# Patient Record
Sex: Male | Born: 1975
Health system: Southern US, Community
[De-identification: ages and names within clinical notes are randomized; demographics above are authoritative.]

## PROBLEM LIST (undated history)

## (undated) DIAGNOSIS — M545 Low back pain, unspecified: Secondary | ICD-10-CM

## (undated) DIAGNOSIS — F1010 Alcohol abuse, uncomplicated: Secondary | ICD-10-CM

## (undated) DIAGNOSIS — F419 Anxiety disorder, unspecified: Secondary | ICD-10-CM

## (undated) DIAGNOSIS — Z9889 Other specified postprocedural states: Secondary | ICD-10-CM

## (undated) DIAGNOSIS — F32A Depression, unspecified: Secondary | ICD-10-CM

## (undated) DIAGNOSIS — G8929 Other chronic pain: Secondary | ICD-10-CM

## (undated) DIAGNOSIS — F1910 Other psychoactive substance abuse, uncomplicated: Secondary | ICD-10-CM

## (undated) DIAGNOSIS — R112 Nausea with vomiting, unspecified: Secondary | ICD-10-CM

## (undated) DIAGNOSIS — T7840XA Allergy, unspecified, initial encounter: Secondary | ICD-10-CM

## (undated) HISTORY — DX: Anxiety disorder, unspecified: F41.9

## (undated) HISTORY — DX: Other psychoactive substance abuse, uncomplicated: F19.10

## (undated) HISTORY — DX: Alcohol abuse, uncomplicated: F10.10

## (undated) HISTORY — PX: SPINE SURGERY: SHX786

## (undated) HISTORY — DX: Allergy, unspecified, initial encounter: T78.40XA

## (undated) HISTORY — PX: BACK SURGERY: SHX140

## (undated) HISTORY — DX: Depression, unspecified: F32.A

---
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
## 2006-04-18 ENCOUNTER — Ambulatory Visit: Payer: Self-pay | Admitting: Internal Medicine

## 2009-02-12 ENCOUNTER — Emergency Department (HOSPITAL_COMMUNITY): Admission: EM | Admit: 2009-02-12 | Discharge: 2009-02-12 | Payer: Self-pay | Admitting: Family Medicine

## 2009-10-27 ENCOUNTER — Ambulatory Visit: Payer: Self-pay | Admitting: Family Medicine

## 2009-10-27 DIAGNOSIS — E785 Hyperlipidemia, unspecified: Secondary | ICD-10-CM | POA: Insufficient documentation

## 2009-10-27 DIAGNOSIS — F1010 Alcohol abuse, uncomplicated: Secondary | ICD-10-CM | POA: Insufficient documentation

## 2009-10-27 DIAGNOSIS — I10 Essential (primary) hypertension: Secondary | ICD-10-CM | POA: Insufficient documentation

## 2009-10-29 LAB — CONVERTED CEMR LAB
BUN: 12 mg/dL (ref 6–23)
Basophils Absolute: 0 10*3/uL (ref 0.0–0.1)
Bilirubin, Direct: 0.1 mg/dL (ref 0.0–0.3)
Chloride: 105 meq/L (ref 96–112)
Cholesterol: 179 mg/dL (ref 0–200)
Creatinine, Ser: 1.4 mg/dL (ref 0.4–1.5)
Eosinophils Relative: 2 % (ref 0.0–5.0)
GFR calc non Af Amer: 61.06 mL/min (ref >60)
Glucose, Bld: 94 mg/dL (ref 70–99)
HCT: 49.2 % (ref 39.0–52.0)
LDL Cholesterol: 127 mg/dL — ABNORMAL HIGH (ref 0–99)
Lymphs Abs: 2.2 10*3/uL (ref 0.7–4.0)
MCV: 96.4 fL (ref 78.0–100.0)
Monocytes Absolute: 0.7 10*3/uL (ref 0.1–1.0)
Neutrophils Relative %: 60.7 % (ref 43.0–77.0)
Platelets: 188 10*3/uL (ref 150.0–400.0)
Potassium: 5.7 meq/L — ABNORMAL HIGH (ref 3.5–5.1)
RDW: 12.6 % (ref 11.5–14.6)
TSH: 1.24 microintl units/mL (ref 0.35–5.50)
Total Bilirubin: 0.9 mg/dL (ref 0.3–1.2)
Triglycerides: 108 mg/dL (ref 0.0–149.0)
VLDL: 21.6 mg/dL (ref 0.0–40.0)
WBC: 8 10*3/uL (ref 4.5–10.5)

## 2009-10-30 ENCOUNTER — Ambulatory Visit: Payer: Self-pay | Admitting: Family Medicine

## 2009-11-02 LAB — CONVERTED CEMR LAB
CO2: 29 meq/L (ref 19–32)
Chloride: 106 meq/L (ref 96–112)
Glucose, Bld: 90 mg/dL (ref 70–99)
Potassium: 4.8 meq/L (ref 3.5–5.1)
Sodium: 142 meq/L (ref 135–145)

## 2015-07-13 ENCOUNTER — Emergency Department (HOSPITAL_COMMUNITY)
Admission: EM | Admit: 2015-07-13 | Discharge: 2015-07-13 | Disposition: A | Discharge status: Home / Self Care | Payer: BLUE CROSS/BLUE SHIELD | Attending: Emergency Medicine | Admitting: Emergency Medicine

## 2015-07-13 ENCOUNTER — Encounter (HOSPITAL_COMMUNITY): Payer: Self-pay

## 2015-07-13 DIAGNOSIS — R200 Anesthesia of skin: Secondary | ICD-10-CM | POA: Diagnosis not present

## 2015-07-13 DIAGNOSIS — M5416 Radiculopathy, lumbar region: Secondary | ICD-10-CM

## 2015-07-13 DIAGNOSIS — M549 Dorsalgia, unspecified: Secondary | ICD-10-CM | POA: Diagnosis present

## 2015-07-13 MED ORDER — IBUPROFEN 600 MG PO TABS: 600.0000 mg | ORAL_TABLET | Freq: Four times a day (QID) | ORAL | Status: DC | PRN

## 2015-07-13 MED ORDER — HYDROMORPHONE HCL 1 MG/ML IJ SOLN
1.0000 mg | Freq: Once | INTRAMUSCULAR | Status: AC
  Administered 2015-07-13: 1 mg via INTRAMUSCULAR
  Filled 2015-07-13: qty 1

## 2015-07-13 MED ORDER — METHOCARBAMOL 500 MG PO TABS: 500.0000 mg | ORAL_TABLET | Freq: Four times a day (QID) | ORAL | Status: DC

## 2015-07-13 MED ORDER — OXYCODONE-ACETAMINOPHEN 5-325 MG PO TABS
1.0000 | ORAL_TABLET | Freq: Four times a day (QID) | ORAL | Status: DC | PRN
Start: 2015-07-13 — End: 2016-03-10

## 2015-07-13 MED ORDER — METHOCARBAMOL 500 MG PO TABS
500.0000 mg | ORAL_TABLET | Freq: Once | ORAL | Status: AC
  Administered 2015-07-13: 500 mg via ORAL
  Filled 2015-07-13: qty 1

## 2015-07-13 MED ORDER — ONDANSETRON 4 MG PO TBDP
4.0000 mg | ORAL_TABLET | Freq: Once | ORAL | Status: AC
  Administered 2015-07-13: 4 mg via ORAL
  Filled 2015-07-13: qty 1

## 2015-07-13 NOTE — ED Provider Notes (Signed)
CSN: [id number]     Arrival date & time 07/13/15  0548 History   First MD Initiated Contact with Patient 07/13/15 0601     Chief Complaint  Patient presents with  . Sciatica  . Back Pain     (Consider location/radiation/quality/duration/timing/severity/associated sxs/prior Treatment) HPI Comments: Patient presents with complaints of back pain with radiation into right leg which has been persistent over the past 2-3 weeks. Patient has had previous episodes as well, but never this severe before he has seen his primary care physician who has prescribed pain medication (Vicodin) and a muscle relaxer. He has also been taking ibuprofen. He has been having muscle spasms which move down the back of his right leg. Patient has a shooting sharp pain that goes down the back of his thigh and into the medial calf and foot. Pain is worse with movement. He did get an injection in his back for muscle spasms 2 weeks ago but this was IM and not an epidural type injection. Patient denies warning symptoms of back pain including: fecal incontinence, urinary retention or overflow incontinence, night sweats, unexplained fevers or weight loss, h/o cancer, IVDU, recent trauma.     Patient is a 39 y.o. male presenting with back pain. The history is provided by the patient.  Back Pain Associated symptoms: numbness (Right calf)   Associated symptoms: no fever and no weakness     History reviewed. No pertinent past medical history. History reviewed. No pertinent past surgical history. No family history on file. History  Substance Use Topics  . Smoking status: Never Smoker   . Smokeless tobacco: Current User  . Alcohol Use: Not on file    Review of Systems  Constitutional: Negative for fever and unexpected weight change.  Gastrointestinal: Negative for constipation.       Neg for fecal incontinence  Genitourinary: Negative for hematuria, flank pain and difficulty urinating.       Negative for urinary  incontinence or retention  Musculoskeletal: Positive for back pain.  Neurological: Positive for numbness (Right calf). Negative for weakness.       Negative for saddle paresthesias       Allergies  Review of patient's allergies indicates no known allergies.  Home Medications   Prior to Admission medications   Medication Sig Start Date End Date Taking? Authorizing Provider  ibuprofen (ADVIL,MOTRIN) 200 MG tablet Take 200 mg by mouth every 6 (six) hours as needed for moderate pain.   Yes Historical Provider, MD   BP 126/78 mmHg  Pulse 77  Temp(Src) 98.4 F (36.9 C) (Oral)  Resp 22  Ht 6\' 4"  (1.93 m)  Wt 212 lb (96.163 kg)  BMI 25.82 kg/m2  SpO2 99% Physical Exam  Constitutional: He appears well-developed and well-nourished.  HENT:  Head: Normocephalic and atraumatic.  Eyes: Conjunctivae are normal.  Neck: Normal range of motion.  Abdominal: Soft. There is no tenderness. There is no CVA tenderness.  Musculoskeletal: Normal range of motion. He exhibits no tenderness.  No step-off noted with palpation of spine. + straight leg test on right.   Neurological: He is alert. He has normal reflexes. No sensory deficit. He exhibits normal muscle tone.  5/5 strength in entire lower extremities bilaterally. Decreased sensation medial R foot and calf, but not true numbness.   Skin: Skin is warm and dry.  Psychiatric: He has a normal mood and affect.  Nursing note and vitals reviewed.   ED Course  Procedures (including critical care time) Labs Review Labs Reviewed -  No data to display  Imaging Review No results found.   EKG Interpretation None      6:10 AM Patient seen and examined. Medications ordered.   Vital signs reviewed and are as follows: Filed Vitals:   07/13/15 0555  BP: 126/78  Pulse: 77  Temp: 98.4 F (36.9 C)  Resp: 22    7:25 AM Patient notes improvement in symptoms.   No red flag s/s of low back pain. Patient was counseled on back pain precautions and  told to do activity as tolerated but do not lift, push, or pull heavy objects more than 10 pounds for the next week.  Patient counseled to use ice or heat on back for no longer than 15 minutes every hour.   Patient prescribed muscle relaxer and counseled on proper use of muscle relaxant medication.    Patient prescribed narcotic pain medicine and counseled on proper use of narcotic pain medications. Counseled not to combine this medication with others containing tylenol. Discussed not to combine Percocet with the Vicodin he currently has at home.   Urged patient not to drink alcohol, drive, or perform any other activities that requires focus while taking either of these medications.  Patient urged to follow-up with PCP if pain does not improve with treatment and rest or if pain becomes recurrent. Urged to return with worsening severe pain, loss of bowel or bladder control, trouble walking.   The patient verbalizes understanding and agrees with the plan.    MDM   Final diagnoses:  Lumbar radiculopathy   Patient with back pain with lumbar radicular features in S1 distribution. No neurological deficits. Patient is ambulatory. No warning symptoms of back pain including: fecal incontinence, urinary retention or overflow incontinence, night sweats, waking from sleep with back pain, unexplained fevers or weight loss, h/o cancer, IVDU, recent trauma. No concern for cauda equina, epidural abscess, or other serious cause of back pain. Conservative measures such as rest, ice/heat and pain medicine indicated with PCP/ortho/NS follow-up.      Renne Crigler, PA-C 07/13/15 0745  Marisa Severin, MD 07/13/15 [phone]

## 2015-07-13 NOTE — ED Notes (Signed)
Pt states that woke up around 330 am with shooting pain down leg, states has had back pain for the past month, waiting to get in with [facility], never diagnosed with sciatica, states he thinks he has a bulging disk.

## 2015-07-13 NOTE — Discharge Instructions (Signed)
Please read and follow all provided instructions.  Your diagnoses today include:  1. Lumbar radiculopathy    Tests performed today include:  Vital signs - see below for your results today  Medications prescribed:   Percocet (oxycodone/acetaminophen) - narcotic pain medication  DO NOT drive or perform any activities that require you to be awake and alert because this medicine can make you drowsy. BE VERY CAREFUL not to take multiple medicines containing Tylenol (also called acetaminophen). Doing so can lead to an overdose which can damage your liver and cause liver failure and possibly death.   Ibuprofen (Motrin, Advil) - anti-inflammatory pain medication  Do not exceed  ibuprofen every 6 hours, take with food  You have been prescribed an anti-inflammatory medication or NSAID. Take with food. Take smallest effective dose for the shortest duration needed for your pain. Stop taking if you experience stomach pain or vomiting.    Robaxin (methocarbamol) - muscle relaxer medication  DO NOT drive or perform any activities that require you to be awake and alert because this medicine can make you drowsy.   Take any prescribed medications only as directed.  Home care instructions:   Follow any educational materials contained in this packet  Please rest, use ice or heat on your back for the next several days  Do not lift, push, pull anything more than 10 pounds for the next week  Follow-up instructions: Please follow-up with your primary care provider in the next 1 week for further evaluation of your symptoms. Follow-up with ortho referral as planned.   Return instructions:  SEEK IMMEDIATE MEDICAL ATTENTION IF YOU HAVE:  New weakness, or problem with the use of your arms or legs  Severe back pain not relieved with medications  Loss control of your bowels or bladder  Increasing pain in any areas of the body (such as chest or abdominal pain)  Shortness of breath, dizziness,  or fainting.   Worsening nausea (feeling sick to your stomach), vomiting, fever, or sweats  Any other emergent concerns regarding your health   Additional Information:  Your vital signs today were: BP 123/77 mmHg   Pulse 68   Temp(Src) 98.4 F (36.9 C) (Oral)   Resp 22   Ht  (1.93 m)   Wt 212 lb (96.163 kg)   BMI 25.82 kg/m2   SpO2 97% If your blood pressure (BP) was elevated above 135/85 this visit, please have this repeated by your doctor within one month. --------------

## 2015-07-17 ENCOUNTER — Other Ambulatory Visit: Payer: Self-pay | Admitting: Sports Medicine

## 2015-07-17 DIAGNOSIS — M545 Low back pain: Secondary | ICD-10-CM

## 2015-07-24 ENCOUNTER — Ambulatory Visit
Admission: RE | Admit: 2015-07-24 | Discharge: 2015-07-24 | Disposition: A | Discharge status: Home / Self Care | Payer: BLUE CROSS/BLUE SHIELD | Source: Ambulatory Visit | Attending: Sports Medicine | Admitting: Sports Medicine

## 2015-07-24 DIAGNOSIS — M545 Low back pain: Secondary | ICD-10-CM

## 2016-02-11 ENCOUNTER — Other Ambulatory Visit: Payer: Self-pay | Admitting: Orthopaedic Surgery

## 2016-02-11 DIAGNOSIS — M545 Low back pain: Secondary | ICD-10-CM

## 2016-02-20 ENCOUNTER — Ambulatory Visit
Admission: RE | Admit: 2016-02-20 | Discharge: 2016-02-20 | Disposition: A | Discharge status: Home / Self Care | Payer: 59 | Source: Ambulatory Visit | Attending: Orthopaedic Surgery | Admitting: Orthopaedic Surgery

## 2016-02-20 DIAGNOSIS — M545 Low back pain: Secondary | ICD-10-CM

## 2016-03-02 ENCOUNTER — Other Ambulatory Visit: Payer: Self-pay | Admitting: Orthopaedic Surgery

## 2016-03-10 NOTE — Pre-Procedure Instructions (Signed)
    David HolmCraig Liscano  03/10/2016      [facility] [id number] [place]- [place], [place] - [address] [facility] [address] [facility] [address] [facility] [address] [place] [id number] Phone: [phone] Fax: [phone]    Your procedure is scheduled on Fri, Mar 24 @ 9:50 AM  [facility] Admitting at 7:45 AM  Call this number if you have problems the morning of surgery:  [phone]   Remember:  Do not eat food or drink liquids after midnight.  Take these medicines the morning of surgery with A SIP OF WATER Gabapentin(Neurontin) and Pain Pill(if needed)              Stop taking your Ibuprofen. No Goody's,BC's,Aleve,Advil,Motrin,Aspirin,Fish Oil,or any Herbal Medications.    Do not wear jewelry.  Do not wear lotions, powders, or colognes.  You may wear deodorant.             Men may shave face and neck.  Do not bring valuables to the hospital.  [facility] is not responsible for any belongings or valuables.  Contacts, dentures or bridgework may not be worn into surgery.  Leave your suitcase in the car.  After surgery it may be brought to your room.  For patients admitted to the hospital, discharge time will be determined by your treatment team.  Patients discharged the day of surgery will not be allowed to drive home.    Special instructions:    Please read over the following fact sheets that you were given. Pain Booklet, Coughing and Deep Breathing, MRSA Information and Surgical Site Infection Prevention

## 2016-03-11 ENCOUNTER — Encounter (HOSPITAL_COMMUNITY): Payer: Self-pay

## 2016-03-11 ENCOUNTER — Ambulatory Visit (HOSPITAL_COMMUNITY)
Admission: RE | Admit: 2016-03-11 | Discharge: 2016-03-11 | Disposition: A | Discharge status: Home / Self Care | Payer: 59 | Source: Ambulatory Visit | Attending: Orthopaedic Surgery | Admitting: Orthopaedic Surgery

## 2016-03-11 ENCOUNTER — Encounter (HOSPITAL_COMMUNITY)
Admission: RE | Admit: 2016-03-11 | Discharge: 2016-03-11 | Disposition: A | Discharge status: Home / Self Care | Payer: 59 | Source: Ambulatory Visit | Attending: Orthopaedic Surgery | Admitting: Orthopaedic Surgery

## 2016-03-11 DIAGNOSIS — M5126 Other intervertebral disc displacement, lumbar region: Secondary | ICD-10-CM

## 2016-03-11 DIAGNOSIS — Z01818 Encounter for other preprocedural examination: Secondary | ICD-10-CM | POA: Insufficient documentation

## 2016-03-11 DIAGNOSIS — Z01812 Encounter for preprocedural laboratory examination: Secondary | ICD-10-CM | POA: Insufficient documentation

## 2016-03-11 HISTORY — DX: Other specified postprocedural states: Z98.890

## 2016-03-11 HISTORY — DX: Other specified postprocedural states: R11.2

## 2016-03-11 LAB — COMPREHENSIVE METABOLIC PANEL
ALBUMIN: 4 g/dL (ref 3.5–5.0)
ALK PHOS: 55 U/L (ref 38–126)
ALT: 38 U/L (ref 17–63)
ANION GAP: 7 (ref 5–15)
AST: 26 U/L (ref 15–41)
BUN: 20 mg/dL (ref 6–20)
CALCIUM: 9.5 mg/dL (ref 8.9–10.3)
CO2: 28 mmol/L (ref 22–32)
Chloride: 107 mmol/L (ref 101–111)
Creatinine, Ser: 0.98 mg/dL (ref 0.61–1.24)
GFR calc Af Amer: >60 mL/min (ref >60)
GFR calc non Af Amer: >60 mL/min (ref >60)
GLUCOSE: 93 mg/dL (ref 65–99)
POTASSIUM: 4.7 mmol/L (ref 3.5–5.1)
SODIUM: 142 mmol/L (ref 135–145)
TOTAL PROTEIN: 6.7 g/dL (ref 6.5–8.1)
Total Bilirubin: 0.3 mg/dL (ref 0.3–1.2)

## 2016-03-11 LAB — CBC
HCT: 44.4 % (ref 39.0–52.0)
Hemoglobin: 14.8 g/dL (ref 13.0–17.0)
MCH: 30.5 pg (ref 26.0–34.0)
MCHC: 33.3 g/dL (ref 30.0–36.0)
MCV: 91.4 fL (ref 78.0–100.0)
Platelets: 173 10*3/uL (ref 150–400)
RBC: 4.86 MIL/uL (ref 4.22–5.81)
RDW: 12.6 % (ref 11.5–15.5)
WBC: 6.1 10*3/uL (ref 4.0–10.5)

## 2016-03-11 LAB — PROTIME-INR
INR: 1.02 (ref 0.00–1.49)
Prothrombin Time: 13.6 seconds (ref 11.6–15.2)

## 2016-03-11 LAB — SURGICAL PCR SCREEN
MRSA, PCR: NEGATIVE
STAPHYLOCOCCUS AUREUS: NEGATIVE

## 2016-03-17 NOTE — H&P (Signed)
David HolmCraig Salazar is an 40 y.o. male.    The patient returns.  He is having persistent problems with back and right leg pain with persistent radicular symptoms, weakness in his leg, pain that bothers him at night, pain that bothers him when he tries to work.  He denies left leg symptoms.  He has had a epidural and due to continued ongoing symptoms, which has been going on now for almost a year, a MRI was obtained on 02/22/2016 lumbar and it is compared to 07/24/2015 lumbar MRI.  This shows some resolution of the hematoma that was present at the 5-1 level on the right and he has an extruded fragment inferior with severe right subarticular compression of the S1 nerve root.  The patient had associated hemorrhage at the time which was epidural and this has resolved.  There is continued mass effect on the right S1 nerve root.  He has slight extraforaminal protrusions on the left at 3-4 and 4-5 which are asymptomatic.   We discussed degenerative disk disease, narrowing, edematous changes at the disk space.  We discussed options including repeat epidural.  The patient states at this point he wants to proceed with operative intervention with microdiscectomy.  He has been using ibuprofen also hydrocodone 5/325.  He has been on Tylenol #3, Ultram, Medrol Dosepak, gabapentin, amitriptyline, Valium among others for treatment.  He is continuing to use Norco at this point and states he wants to be off the medicine, is concerned about how much he has consumed.   FAMILY HISTORY:   Positive for diabetes.   SOCIAL HISTORY:   Married to his wife Erskine SquibbJane.  Does not smoke or drink.   REVIEW OF SYSTEMS:   A 14-point review of systems negative for cardiac problems from rheumatologic.  No pulmonary problems.       Past Medical History  Diagnosis Date  . PONV (postoperative nausea and vomiting)     Past Surgical History  Procedure Laterality Date  . Wisdom tooth extraction      No family history on file. Social History:   reports that he has been smoking E-cigarettes and Cigarettes.  He has smoked for the past 15 years. He uses smokeless tobacco. He reports that he does not drink alcohol or use illicit drugs.  Allergies: No Known Allergies  No prescriptions prior to admission    No results found for this or any previous visit (from the past 48 hour(s)). No results found.  Review of Systems  Constitutional: Negative.   HENT: Negative.   Respiratory: Negative.   Cardiovascular: Negative.   Gastrointestinal: Negative.   Genitourinary: Negative.   Musculoskeletal: Positive for back pain.  Skin: Negative.   Psychiatric/Behavioral: Negative.     There were no vitals taken for this visit. Physical Exam  Constitutional: He is oriented to person, place, and time. No distress.  HENT:  Head: Atraumatic.  Eyes: EOM are normal.  Neck: Normal range of motion.  Cardiovascular: Normal rate.   Respiratory: No respiratory distress.  GI: He exhibits no distension.  Musculoskeletal: He exhibits tenderness.  Neurological: He is alert and oriented to person, place, and time.  Skin: Skin is warm and dry.  Psychiatric: He has a normal mood and affect.    PHYSICAL EXAMINATION:  The patient is alert and oriented, WD, WN, NAD.  Extraocular movements intact.  Good range of motion of cervical spine.  Reflexes are 2+ in the upper extremities.  He has absent ankle jerk on the right, 2+ on  the left.  Positive straight leg raising for ipsilateral and he has positive contralateral straight leg raise.  No clonus.  He fatigues out with toe raises quickly on the right, not on the left.     ASSESSMENT:  Right L5-S1 HNP with some extruded fragment, continued S1 nerve root compression.   PLAN:  The patient would like to proceed with the microdiscectomy.  We discussed overnight stay, operative technique.  Discussed risk.  Looked at the spine model.  Informed consent was obtained.  He understands that he would be on a walker program,  avoid sitting for 6 weeks after surgery and then can resume normal work activities.  We will give him a work note once we determine the date per scheduling.  All questions answered.     Naida Sleight, PA-C 03/17/2016, 2:39 PM

## 2016-03-18 ENCOUNTER — Observation Stay (HOSPITAL_COMMUNITY)
Admission: RE | Admit: 2016-03-18 | Discharge: 2016-03-19 | Disposition: A | Discharge status: Home / Self Care | Payer: 59 | Source: Ambulatory Visit | Attending: Orthopaedic Surgery | Admitting: Orthopaedic Surgery

## 2016-03-18 ENCOUNTER — Encounter (HOSPITAL_COMMUNITY)
Admission: RE | Disposition: A | Discharge status: Home / Self Care | Payer: Self-pay | Source: Ambulatory Visit | Attending: Orthopaedic Surgery

## 2016-03-18 ENCOUNTER — Ambulatory Visit (HOSPITAL_COMMUNITY): Payer: 59

## 2016-03-18 ENCOUNTER — Ambulatory Visit (HOSPITAL_COMMUNITY): Payer: 59 | Admitting: Anesthesiology

## 2016-03-18 ENCOUNTER — Encounter (HOSPITAL_COMMUNITY): Payer: Self-pay | Admitting: Anesthesiology

## 2016-03-18 DIAGNOSIS — F17210 Nicotine dependence, cigarettes, uncomplicated: Secondary | ICD-10-CM | POA: Insufficient documentation

## 2016-03-18 DIAGNOSIS — M5117 Intervertebral disc disorders with radiculopathy, lumbosacral region: Secondary | ICD-10-CM | POA: Diagnosis not present

## 2016-03-18 DIAGNOSIS — Z23 Encounter for immunization: Secondary | ICD-10-CM | POA: Diagnosis not present

## 2016-03-18 DIAGNOSIS — Z9889 Other specified postprocedural states: Secondary | ICD-10-CM

## 2016-03-18 DIAGNOSIS — Z419 Encounter for procedure for purposes other than remedying health state, unspecified: Secondary | ICD-10-CM

## 2016-03-18 HISTORY — DX: Low back pain: M54.5

## 2016-03-18 HISTORY — PX: LUMBAR LAMINECTOMY/DECOMPRESSION MICRODISCECTOMY: SHX5026

## 2016-03-18 HISTORY — DX: Other chronic pain: G89.29

## 2016-03-18 HISTORY — PX: MICRODISCECTOMY LUMBAR: SUR864

## 2016-03-18 HISTORY — DX: Low back pain, unspecified: M54.50

## 2016-03-18 SURGERY — LUMBAR LAMINECTOMY/DECOMPRESSION MICRODISCECTOMY
Anesthesia: General | Site: Back

## 2016-03-18 MED ORDER — FENTANYL CITRATE (PF) 250 MCG/5ML IJ SOLN
INTRAMUSCULAR | Status: AC
  Filled 2016-03-18: qty 5

## 2016-03-18 MED ORDER — DEXAMETHASONE SODIUM PHOSPHATE 10 MG/ML IJ SOLN
INTRAMUSCULAR | Status: AC
  Filled 2016-03-18: qty 1

## 2016-03-18 MED ORDER — OXYCODONE-ACETAMINOPHEN 5-325 MG PO TABS: 1.0000 | ORAL_TABLET | Freq: Four times a day (QID) | ORAL | Status: DC | PRN

## 2016-03-18 MED ORDER — METHOCARBAMOL 500 MG PO TABS: 500.0000 mg | ORAL_TABLET | Freq: Four times a day (QID) | ORAL | Status: DC | PRN

## 2016-03-18 MED ORDER — EPHEDRINE SULFATE 50 MG/ML IJ SOLN
INTRAMUSCULAR | Status: DC | PRN
  Administered 2016-03-18: 10 mg via INTRAVENOUS

## 2016-03-18 MED ORDER — SODIUM CHLORIDE 0.9 % IV SOLN: 250.0000 mL | INTRAVENOUS | Status: DC

## 2016-03-18 MED ORDER — DEXTROSE 5 % IV SOLN
2.0000 g | INTRAVENOUS | Status: AC
  Administered 2016-03-18: 2 g via INTRAVENOUS
  Filled 2016-03-18: qty 20

## 2016-03-18 MED ORDER — ONDANSETRON HCL 4 MG/2ML IJ SOLN
INTRAMUSCULAR | Status: AC
  Filled 2016-03-18: qty 2

## 2016-03-18 MED ORDER — GLYCOPYRROLATE 0.2 MG/ML IJ SOLN
INTRAMUSCULAR | Status: DC | PRN
  Administered 2016-03-18: 0.4 mg via INTRAVENOUS

## 2016-03-18 MED ORDER — BISACODYL 10 MG RE SUPP: 10.0000 mg | Freq: Every day | RECTAL | Status: DC | PRN

## 2016-03-18 MED ORDER — DOCUSATE SODIUM 100 MG PO CAPS
100.0000 mg | ORAL_CAPSULE | Freq: Two times a day (BID) | ORAL | Status: DC
  Administered 2016-03-18 – 2016-03-19 (×2): 100 mg via ORAL
  Filled 2016-03-18 (×3): qty 1

## 2016-03-18 MED ORDER — ROCURONIUM BROMIDE 100 MG/10ML IV SOLN
INTRAVENOUS | Status: DC | PRN
  Administered 2016-03-18: 50 mg via INTRAVENOUS

## 2016-03-18 MED ORDER — AMITRIPTYLINE HCL 25 MG PO TABS
25.0000 mg | ORAL_TABLET | Freq: Every day | ORAL | Status: DC
  Administered 2016-03-18: 25 mg via ORAL
  Filled 2016-03-18: qty 1

## 2016-03-18 MED ORDER — SODIUM CHLORIDE 0.45 % IV SOLN
INTRAVENOUS | Status: DC
Start: 2016-03-18 — End: 2016-03-19

## 2016-03-18 MED ORDER — INFLUENZA VAC SPLIT QUAD 0.5 ML IM SUSY
0.5000 mL | PREFILLED_SYRINGE | INTRAMUSCULAR | Status: AC
  Administered 2016-03-19: 0.5 mL via INTRAMUSCULAR
  Filled 2016-03-18: qty 0.5

## 2016-03-18 MED ORDER — HYDROMORPHONE HCL 1 MG/ML IJ SOLN
0.2500 mg | INTRAMUSCULAR | Status: DC | PRN
  Administered 2016-03-18 (×2): 0.5 mg via INTRAVENOUS

## 2016-03-18 MED ORDER — METHOCARBAMOL 1000 MG/10ML IJ SOLN
500.0000 mg | Freq: Four times a day (QID) | INTRAVENOUS | Status: DC | PRN
  Filled 2016-03-18: qty 5

## 2016-03-18 MED ORDER — ROCURONIUM BROMIDE 50 MG/5ML IV SOLN
INTRAVENOUS | Status: AC
  Filled 2016-03-18: qty 1

## 2016-03-18 MED ORDER — PHENOL 1.4 % MT LIQD: 1.0000 | OROMUCOSAL | Status: DC | PRN

## 2016-03-18 MED ORDER — 0.9 % SODIUM CHLORIDE (POUR BTL) OPTIME
TOPICAL | Status: DC | PRN
  Administered 2016-03-18: 1000 mL

## 2016-03-18 MED ORDER — DEXAMETHASONE SODIUM PHOSPHATE 10 MG/ML IJ SOLN
INTRAMUSCULAR | Status: DC | PRN
  Administered 2016-03-18: 10 mg via INTRAVENOUS

## 2016-03-18 MED ORDER — MENTHOL 3 MG MT LOZG: 1.0000 | LOZENGE | OROMUCOSAL | Status: DC | PRN

## 2016-03-18 MED ORDER — CEFAZOLIN SODIUM 1-5 GM-% IV SOLN
1.0000 g | Freq: Three times a day (TID) | INTRAVENOUS | Status: AC
  Administered 2016-03-18 – 2016-03-19 (×2): 1 g via INTRAVENOUS
  Filled 2016-03-18 (×2): qty 50

## 2016-03-18 MED ORDER — BUPIVACAINE HCL (PF) 0.25 % IJ SOLN
INTRAMUSCULAR | Status: DC | PRN
  Administered 2016-03-18: 10 mL

## 2016-03-18 MED ORDER — ONDANSETRON HCL 4 MG/2ML IJ SOLN: 4.0000 mg | INTRAMUSCULAR | Status: DC | PRN

## 2016-03-18 MED ORDER — PROMETHAZINE HCL 25 MG/ML IJ SOLN: 6.2500 mg | INTRAMUSCULAR | Status: DC | PRN

## 2016-03-18 MED ORDER — HYDROCODONE-ACETAMINOPHEN 7.5-325 MG PO TABS: 1.0000 | ORAL_TABLET | Freq: Once | ORAL | Status: DC | PRN

## 2016-03-18 MED ORDER — SODIUM CHLORIDE 0.9% FLUSH
3.0000 mL | Freq: Two times a day (BID) | INTRAVENOUS | Status: DC
  Administered 2016-03-18 (×2): 3 mL via INTRAVENOUS

## 2016-03-18 MED ORDER — LIDOCAINE HCL (CARDIAC) 20 MG/ML IV SOLN
INTRAVENOUS | Status: AC
Start: 2016-03-18 — End: 2016-03-18
  Filled 2016-03-18: qty 5

## 2016-03-18 MED ORDER — LACTATED RINGERS IV SOLN
INTRAVENOUS | Status: DC
Start: 2016-03-18 — End: 2016-03-19
  Administered 2016-03-18 (×3): via INTRAVENOUS

## 2016-03-18 MED ORDER — ONDANSETRON HCL 4 MG/2ML IJ SOLN
INTRAMUSCULAR | Status: DC | PRN
  Administered 2016-03-18: 4 mg via INTRAVENOUS

## 2016-03-18 MED ORDER — MIDAZOLAM HCL 2 MG/2ML IJ SOLN
INTRAMUSCULAR | Status: AC
  Filled 2016-03-18: qty 2

## 2016-03-18 MED ORDER — POLYETHYLENE GLYCOL 3350 17 G PO PACK: 17.0000 g | PACK | Freq: Every day | ORAL | Status: DC | PRN

## 2016-03-18 MED ORDER — FENTANYL CITRATE (PF) 100 MCG/2ML IJ SOLN
INTRAMUSCULAR | Status: DC | PRN
  Administered 2016-03-18: 100 ug via INTRAVENOUS
  Administered 2016-03-18: 50 ug via INTRAVENOUS
  Administered 2016-03-18 (×3): 100 ug via INTRAVENOUS
  Administered 2016-03-18: 50 ug via INTRAVENOUS

## 2016-03-18 MED ORDER — HYDROMORPHONE HCL 1 MG/ML IJ SOLN
0.5000 mg | INTRAMUSCULAR | Status: DC | PRN
  Administered 2016-03-18 (×2): 1 mg via INTRAVENOUS
  Filled 2016-03-18 (×2): qty 1

## 2016-03-18 MED ORDER — GLYCOPYRROLATE 0.2 MG/ML IJ SOLN
INTRAMUSCULAR | Status: AC
  Filled 2016-03-18: qty 2

## 2016-03-18 MED ORDER — BUPIVACAINE HCL (PF) 0.25 % IJ SOLN
INTRAMUSCULAR | Status: AC
  Filled 2016-03-18: qty 30

## 2016-03-18 MED ORDER — CHLORHEXIDINE GLUCONATE 4 % EX LIQD
60.0000 mL | Freq: Once | CUTANEOUS | Status: DC
Start: 2016-03-18 — End: 2016-03-18

## 2016-03-18 MED ORDER — PROPOFOL 10 MG/ML IV BOLUS
INTRAVENOUS | Status: AC
  Filled 2016-03-18: qty 20

## 2016-03-18 MED ORDER — SODIUM CHLORIDE 0.9% FLUSH: 3.0000 mL | INTRAVENOUS | Status: DC | PRN

## 2016-03-18 MED ORDER — HYDROMORPHONE HCL 1 MG/ML IJ SOLN
INTRAMUSCULAR | Status: AC
Start: 2016-03-18 — End: 2016-03-18
  Administered 2016-03-18: 1 mg via INTRAVENOUS
  Filled 2016-03-18: qty 1

## 2016-03-18 MED ORDER — MIDAZOLAM HCL 5 MG/5ML IJ SOLN
INTRAMUSCULAR | Status: DC | PRN
  Administered 2016-03-18: 2 mg via INTRAVENOUS

## 2016-03-18 MED ORDER — GABAPENTIN 300 MG PO CAPS
300.0000 mg | ORAL_CAPSULE | Freq: Three times a day (TID) | ORAL | Status: DC
  Administered 2016-03-18 – 2016-03-19 (×3): 300 mg via ORAL
  Filled 2016-03-18 (×3): qty 1

## 2016-03-18 MED ORDER — NEOSTIGMINE METHYLSULFATE 10 MG/10ML IV SOLN
INTRAVENOUS | Status: AC
  Filled 2016-03-18: qty 1

## 2016-03-18 MED ORDER — NEOSTIGMINE METHYLSULFATE 10 MG/10ML IV SOLN
INTRAVENOUS | Status: DC | PRN
  Administered 2016-03-18: 3 mg via INTRAVENOUS

## 2016-03-18 MED ORDER — PROPOFOL 10 MG/ML IV BOLUS
INTRAVENOUS | Status: DC | PRN
  Administered 2016-03-18: 200 mg via INTRAVENOUS

## 2016-03-18 MED ORDER — LIDOCAINE HCL (CARDIAC) 20 MG/ML IV SOLN
INTRAVENOUS | Status: DC | PRN
  Administered 2016-03-18: 60 mg via INTRAVENOUS

## 2016-03-18 MED ORDER — OXYCODONE-ACETAMINOPHEN 5-325 MG PO TABS
1.0000 | ORAL_TABLET | Freq: Four times a day (QID) | ORAL | Status: DC | PRN
  Administered 2016-03-18 – 2016-03-19 (×3): 2 via ORAL
  Filled 2016-03-18 (×3): qty 2

## 2016-03-18 MED ORDER — METHOCARBAMOL 500 MG PO TABS
500.0000 mg | ORAL_TABLET | Freq: Four times a day (QID) | ORAL | Status: DC | PRN
  Administered 2016-03-18 – 2016-03-19 (×2): 500 mg via ORAL
  Filled 2016-03-18 (×2): qty 1

## 2016-03-18 SURGICAL SUPPLY — 47 items
ADH SKN CLS APL DERMABOND .7 (GAUZE/BANDAGES/DRESSINGS) ×1
APL SKNCLS STERI-STRIP NONHPOA (GAUZE/BANDAGES/DRESSINGS) ×1
BENZOIN TINCTURE PRP APPL 2/3 (GAUZE/BANDAGES/DRESSINGS) ×1 IMPLANT
BUR ROUND FLUTED 4 SOFT TCH (BURR) ×1 IMPLANT
CLSR STERI-STRIP ANTIMIC 1/2X4 (GAUZE/BANDAGES/DRESSINGS) ×2 IMPLANT
COVER SURGICAL LIGHT HANDLE (MISCELLANEOUS) ×2 IMPLANT
DERMABOND ADVANCED (GAUZE/BANDAGES/DRESSINGS) ×1
DERMABOND ADVANCED .7 DNX12 (GAUZE/BANDAGES/DRESSINGS) ×1 IMPLANT
DRAPE MICROSCOPE LEICA (MISCELLANEOUS) ×2 IMPLANT
DRAPE PROXIMA HALF (DRAPES) ×4 IMPLANT
DRSG MEPILEX BORDER 4X4 (GAUZE/BANDAGES/DRESSINGS) ×1 IMPLANT
DRSG MEPILEX BORDER 4X8 (GAUZE/BANDAGES/DRESSINGS) ×2 IMPLANT
DURAPREP 26ML APPLICATOR (WOUND CARE) ×2 IMPLANT
ELECT REM PT RETURN 9FT ADLT (ELECTROSURGICAL) ×2
ELECTRODE REM PT RTRN 9FT ADLT (ELECTROSURGICAL) ×1 IMPLANT
GLOVE BIO SURGEON STRL SZ7 (GLOVE) ×2 IMPLANT
GLOVE BIOGEL PI IND STRL 8 (GLOVE) ×2 IMPLANT
GLOVE BIOGEL PI INDICATOR 8 (GLOVE) ×2
GLOVE ORTHO TXT STRL SZ7.5 (GLOVE) ×4 IMPLANT
GOWN STRL REUS W/ TWL LRG LVL3 (GOWN DISPOSABLE) ×2 IMPLANT
GOWN STRL REUS W/ TWL XL LVL3 (GOWN DISPOSABLE) ×1 IMPLANT
GOWN STRL REUS W/TWL 2XL LVL3 (GOWN DISPOSABLE) ×2 IMPLANT
GOWN STRL REUS W/TWL LRG LVL3 (GOWN DISPOSABLE) ×4
GOWN STRL REUS W/TWL XL LVL3 (GOWN DISPOSABLE) ×2
KIT BASIN OR (CUSTOM PROCEDURE TRAY) ×2 IMPLANT
KIT ROOM TURNOVER OR (KITS) ×2 IMPLANT
MANIFOLD NEPTUNE II (INSTRUMENTS) ×3 IMPLANT
NDL HYPO 25GX1X1/2 BEV (NEEDLE) ×1 IMPLANT
NDL SPNL 18GX3.5 QUINCKE PK (NEEDLE) ×1 IMPLANT
NEEDLE HYPO 25GX1X1/2 BEV (NEEDLE) ×2 IMPLANT
NEEDLE SPNL 18GX3.5 QUINCKE PK (NEEDLE) ×4 IMPLANT
NS IRRIG 1000ML POUR BTL (IV SOLUTION) ×2 IMPLANT
PACK LAMINECTOMY ORTHO (CUSTOM PROCEDURE TRAY) ×2 IMPLANT
PAD ARMBOARD 7.5X6 YLW CONV (MISCELLANEOUS) ×4 IMPLANT
PATTIES SURGICAL .5 X.5 (GAUZE/BANDAGES/DRESSINGS) ×1 IMPLANT
PATTIES SURGICAL .75X.75 (GAUZE/BANDAGES/DRESSINGS) IMPLANT
POSITIONER HEAD PRONE TRACH (MISCELLANEOUS) ×1 IMPLANT
SUT BONE WAX W31G (SUTURE) IMPLANT
SUT VIC AB 0 CT1 27 (SUTURE) ×2
SUT VIC AB 0 CT1 27XBRD ANBCTR (SUTURE) ×1 IMPLANT
SUT VIC AB 1 CTX 18 (SUTURE) ×1 IMPLANT
SUT VIC AB 2-0 CT1 27 (SUTURE) ×2
SUT VIC AB 2-0 CT1 TAPERPNT 27 (SUTURE) ×1 IMPLANT
SUT VIC AB 3-0 X1 27 (SUTURE) IMPLANT
TOWEL OR 17X24 6PK STRL BLUE (TOWEL DISPOSABLE) ×2 IMPLANT
TOWEL OR 17X26 10 PK STRL BLUE (TOWEL DISPOSABLE) ×3 IMPLANT
WATER STERILE IRR 1000ML POUR (IV SOLUTION) ×2 IMPLANT

## 2016-03-18 NOTE — Brief Op Note (Signed)
03/18/2016  12:13 PM  PATIENT:  David Salazar  40 y.o. male  PRE-OPERATIVE DIAGNOSIS:  Right L5-S1 Herniated Nucleus Pulposus  POST-OPERATIVE DIAGNOSIS:  Right L5-S1 Herniated Nucleus Pulposus  PROCEDURE:  Procedure(s): Right L5-S1 Microdiscectomy (N/A)  SURGEON:  Surgeon(s) and Role:    Eldred Manges* Mark C Yates, MD - Primary  PHYSICIAN ASSISTANT: Peityn Payton m. Mabry Santarelli pa-c    ANESTHESIA:   general  EBL:  Total I/O In: 1600 [I.V.:1600] Out: 50 [Blood:50]  BLOOD ADMINISTERED:none  DRAINS: none   LOCAL MEDICATIONS USED:  MARCAINE     SPECIMEN:  No Specimen  DISPOSITION OF SPECIMEN:  N/A  COUNTS:  YES  TOURNIQUET:  * No tourniquets in log *  DICTATION: .Dragon Dictation  PLAN OF CARE: Admit for overnight observation  PATIENT DISPOSITION:  PACU - hemodynamically stable.

## 2016-03-18 NOTE — Anesthesia Procedure Notes (Signed)
Procedure Name: Intubation Date/Time: 03/18/2016 10:15 AM Performed by: Orlinda BlalockMCMILLEN, Paiten Boies L Pre-anesthesia Checklist: Patient identified, Emergency Drugs available, Suction available and Patient being monitored Patient Re-evaluated:Patient Re-evaluated prior to inductionOxygen Delivery Method: Circle System Utilized Preoxygenation: Pre-oxygenation with 100% oxygen Intubation Type: IV induction Ventilation: Mask ventilation without difficulty Laryngoscope Size: Mac and 4 Grade View: Grade I Tube type: Oral Tube size: 8.0 mm Number of attempts: 1 Airway Equipment and Method: Stylet and Oral airway Placement Confirmation: ETT inserted through vocal cords under direct vision,  positive ETCO2 and breath sounds checked- equal and bilateral Secured at: 23 cm Tube secured with: Tape Dental Injury: Teeth and Oropharynx as per pre-operative assessment

## 2016-03-18 NOTE — Interval H&P Note (Signed)
History and Physical Interval Note:  03/18/2016 9:05 AM  David Salazar  has presented today for surgery, with the diagnosis of Right L5-S1 Herniated Nucleus Pulposus  The various methods of treatment have been discussed with the patient and family. After consideration of risks, benefits and other options for treatment, the patient has consented to  Procedure(s): Right L5-S1 Microdiscectomy (N/A) as a surgical intervention .  The patient's history has been reviewed, patient examined, no change in status, stable for surgery.  I have reviewed the patient's chart and labs.  Questions were answered to the patient's satisfaction.     Meg Niemeier C

## 2016-03-18 NOTE — Progress Notes (Signed)
Report given by Blane Oharaaroline RN from PACU. Patient in room and comfortable. Will continue to monitor.

## 2016-03-18 NOTE — Anesthesia Postprocedure Evaluation (Signed)
Anesthesia Post Note  Patient: Pricilla HolmCraig Magana  Procedure(s) Performed: Procedure(s) (LRB): Right L5-S1 Microdiscectomy (N/A)  Patient location during evaluation: PACU Anesthesia Type: General Level of consciousness: awake and alert Pain management: pain level controlled Vital Signs Assessment: post-procedure vital signs reviewed and stable Respiratory status: spontaneous breathing, nonlabored ventilation, respiratory function stable and patient connected to nasal cannula oxygen Cardiovascular status: blood pressure returned to baseline and stable Postop Assessment: no signs of nausea or vomiting Anesthetic complications: no    Last Vitals:  Filed Vitals:   03/18/16 1245 03/18/16 1300  BP: 107/84 104/67  Pulse: 64 59  Temp:    Resp: 12 8    Last Pain:  Filed Vitals:   03/18/16 1303  PainSc: Asleep                 Kennieth RadFitzgerald, Darenda Fike E

## 2016-03-18 NOTE — Anesthesia Preprocedure Evaluation (Addendum)
Anesthesia Evaluation  Patient identified by MRN, date of birth, ID band Patient awake    Reviewed: Allergy & Precautions, NPO status , Patient's Chart, lab work & pertinent test results  Airway Mallampati: II  TM Distance: >3 FB Neck ROM: Full    Dental  (+) Dental Advisory Given   Pulmonary Current Smoker,    breath sounds clear to auscultation       Cardiovascular negative cardio ROS   Rhythm:Regular Rate:Normal     Neuro/Psych negative neurological ROS     GI/Hepatic negative GI ROS, Neg liver ROS,   Endo/Other  negative endocrine ROS  Renal/GU negative Renal ROS     Musculoskeletal   Abdominal   Peds  Hematology negative hematology ROS (+)   Anesthesia Other Findings   Reproductive/Obstetrics                            Lab Results  Component Value Date   WBC 6.1 03/11/2016   HGB 14.8 03/11/2016   HCT 44.4 03/11/2016   MCV 91.4 03/11/2016   PLT 173 03/11/2016   Lab Results  Component Value Date   CREATININE 0.98 03/11/2016   BUN 20 03/11/2016   NA 142 03/11/2016   K 4.7 03/11/2016   CL 107 03/11/2016   CO2 28 03/11/2016     Anesthesia Physical Anesthesia Plan  ASA: I  Anesthesia Plan: General   Post-op Pain Management:    Induction: Intravenous  Airway Management Planned: Oral ETT  Additional Equipment:   Intra-op Plan:   Post-operative Plan: Extubation in OR  Informed Consent: I have reviewed the patients History and Physical, chart, labs and discussed the procedure including the risks, benefits and alternatives for the proposed anesthesia with the patient or authorized representative who has indicated his/her understanding and acceptance.   Dental advisory given  Plan Discussed with: CRNA  Anesthesia Plan Comments:        Anesthesia Quick Evaluation

## 2016-03-18 NOTE — Transfer of Care (Signed)
Immediate Anesthesia Transfer of Care Note  Patient: David Salazar  Procedure(s) Performed: Procedure(s): Right L5-S1 Microdiscectomy (N/A)  Patient Location: PACU  Anesthesia Type:General  Level of Consciousness: awake, alert , oriented and patient cooperative  Airway & Oxygen Therapy: Patient Spontanous Breathing and Patient connected to nasal cannula oxygen  Post-op Assessment: Report given to RN, Post -op Vital signs reviewed and stable and Patient moving all extremities  Post vital signs: Reviewed and stable  Last Vitals:  Filed Vitals:   03/18/16 0811 03/18/16 1146  BP: 117/78 111/70  Pulse: 78 81  Temp: 36.8 C 36.4 C  Resp: 20 6    Complications: No apparent anesthesia complications

## 2016-03-18 NOTE — Op Note (Signed)
NAMPricilla Holm:  Fossett, Bird               ACCOUNT NO.:  [id number]  MEDICAL RECORD NO.:  [id number]  LOCATION:  [facility]                         FACILITY:  [facility]  PHYSICIAN:  Belal Scallon C. Ophelia CharterYates, M.D.    DATE OF BIRTH:  30-Jan-1976  DATE OF PROCEDURE:  03/18/2016 DATE OF DISCHARGE:                              OPERATIVE REPORT   PREOPERATIVE DIAGNOSIS:  Right L5-S1 HNP with radiculopathy.  POSTOPERATIVE DIAGNOSIS:  Right L5-S1 HNP with radiculopathy.  PROCEDURE:  Right L5-S1 microdiskectomy.  SURGEON:  Tiffnay Bossi C. Ophelia CharterYates, M.D.  ASSISTANT:  Genene ChurnJames M. Barry Dieneswens, PA-C medically necessary and present for the entire procedure.  ANESTHESIA:  General plus Marcaine skin local.  ESTIMATED BLOOD LOSS:  Less than 100 mL.  INDICATIONS:  A 40 year old male with persistent problems with H and P, right L5-S1 with radiculopathy who has failed conservative treatment including epidural steroid injections therapy, pain medication, muscle relaxants.  Due to his persistent problems, repeat MRI scan was performed, initially had a large hematoma associated with disk herniation and the followup MRI done on the last 2 weeks showed persistent paracentral disc herniation with pressure at the shoulder of the nerve root with compression consistent with his persistent weakness and nerve root tension signs.  DESCRIPTION OF PROCEDURE:  After induction of general anesthesia, orotracheal intubation, the patient placed prone on chest rolls, standard positioning, prepping, careful padding.  Back was prepped after clipping with DuraPrep.  Area squared with towels, Betadine, Steri-Drape applied and laminectomy sheets and drapes.  Needle localization after time out, cross-table lateral confirmed appropriate level.  A small incision was made 2 mm off the midline to the right subperiosteal section.  Taylor retractor placed laterally and a Penfield 4 was placed at the inferior aspect of the L5 lamina confirmed on lateral x-ray appropriate  level.  There was only 5-6 mm interlaminar space between L5 and S1 with partial shingling.  L5 laminotomy was performed thinning with the bur removing it with Kerrison rongeur removing a small amount of overhanging facet and top portion of S1 lamina on the involved right side.  Nerve root was freed up.  Nerve root was visualized at the edge of the pedicle and then ligamentum was removed using the operative microscope and Kerrison rongeur.  Gentle retraction of the nerve root, some coagulation of prominent veins. There was a paracentral disc protrusion directly on the shoulder of the nerve root, which was actually firm bulge underneath with some fresh fragments that easily teased out with the micropituitary.  Chunks of the firm hard disc, which looked like it had hardened and was almost a consistency of bone were removed piecemeal until was decompressed.  A hockey stick could be passed 180 degrees anterior to the dura and nerve root was well decompressed.  Ligament in the lateral gutter had been removed.  Nerve root was free.  Epidural space was dry, irrigation with saline solution.  Some small amount of bone wax had been used along the bone edge of the lamina, which had been oozing.  This area was dry.  Repeat irrigation and closure of the fascia with #1 Vicryl, 2-0 Vicryl subcutaneous tissue, subcuticular closure, Dermabond on the skin, and postop dressing.  The patient tolerated the procedure well.     Sheffield Hawker C. Ophelia Charter, M.D.     MCY/MEDQ  D:  03/18/2016  T:  03/18/2016  Job:  [id number]

## 2016-03-19 DIAGNOSIS — M5117 Intervertebral disc disorders with radiculopathy, lumbosacral region: Secondary | ICD-10-CM | POA: Diagnosis not present

## 2016-03-19 NOTE — Progress Notes (Signed)
Patient is stable and sitting up in bed. Dressing c/d/i, eager to go home Rx in chart Stable for dc home today  N. Glee ArvinMichael Xu, MD [facility] [phone] 8:51 AM

## 2016-03-21 ENCOUNTER — Encounter (HOSPITAL_COMMUNITY): Payer: Self-pay | Admitting: Orthopaedic Surgery

## 2016-03-21 NOTE — Discharge Summary (Signed)
Patient ID: David Salazar MRN: [id number] DOB/AGE: [birth year] 42 y.o.  Admit date: 03/18/2016 Discharge date: 03/21/2016  Admission Diagnoses:  Active Problems:   S/P lumbar discectomy   Discharge Diagnoses:  Active Problems:   S/P lumbar discectomy  status post Procedure(s): Right L5-S1 Microdiscectomy  Past Medical History  Diagnosis Date  . PONV (postoperative nausea and vomiting)     "just w/my wisdom teeth" (03/18/2016)  . Chronic lower back pain     "hopefull not after this OR today" (03/18/2016)    Surgeries: Procedure(s): Right L5-S1 Microdiscectomy on 03/18/2016   Consultants:    Discharged Condition: Improved  Hospital Course: David HolmCraig Salazar is an 40 y.o. male who was admitted 03/18/2016 for operative treatment of lumbar HNP. Patient failed conservative treatments (please see the history and physical for the specifics) and had severe unremitting pain that affects sleep, daily activities and work/hobbies. After pre-op clearance, the patient was taken to the operating room on 03/18/2016 and underwent  Procedure(s): Right L5-S1 Microdiscectomy.    Patient was given perioperative antibiotics:  Anti-infectives    Start     Dose/Rate Route Frequency Ordered Stop   03/18/16 1800  ceFAZolin (ANCEF) IVPB 1 g/50 mL premix     1 g 100 mL/hr over 30 Minutes Intravenous Every 8 hours 03/18/16 1416 03/19/16 0217   03/18/16 0807  ceFAZolin (ANCEF) 2 g in dextrose 5 % 50 mL IVPB     2 g 140 mL/hr over 30 Minutes Intravenous On call to O.R. 03/18/16 0807 03/18/16 1035       Patient was given sequential compression devices and early ambulation to prevent DVT.   Patient benefited maximally from hospital stay and there were no complications. At the time of discharge, the patient was urinating/moving their bowels without difficulty, tolerating a regular diet, pain is controlled with oral pain medications and they have been cleared by PT/OT.   Recent vital signs: No data found.     Recent laboratory studies: No results for input(s): WBC, HGB, HCT, PLT, NA, K, CL, CO2, BUN, CREATININE, GLUCOSE, INR, CALCIUM in the last 72 hours.  Invalid input(s): PT, 2   Discharge Medications:     Medication List    STOP taking these medications        acetaminophen 500 MG tablet  Commonly known as:  TYLENOL     HYDROcodone-acetaminophen 5-325 MG tablet  Commonly known as:  NORCO/VICODIN     ibuprofen 200 MG tablet  Commonly known as:  ADVIL,MOTRIN      TAKE these medications        amitriptyline 25 MG tablet  Commonly known as:  ELAVIL  Take 25 mg by mouth at bedtime.     gabapentin 300 MG capsule  Commonly known as:  NEURONTIN  Take 300 mg by mouth 3 (three) times daily.     methocarbamol 500 MG tablet  Commonly known as:  ROBAXIN  Take 1 tablet (500 mg total) by mouth every 6 (six) hours as needed for muscle spasms.     oxyCODONE-acetaminophen 5-325 MG tablet  Commonly known as:  PERCOCET/ROXICET  Take 1-2 tablets by mouth every 6 (six) hours as needed for moderate pain.     traMADol 50 MG tablet  Commonly known as:  ULTRAM  Take 50 mg by mouth every 6 (six) hours as needed for moderate pain.        Diagnostic Studies: Dg Chest 2 View  03/11/2016  CLINICAL DATA:  Lumbar HNP, preop EXAM: CHEST  2 VIEW COMPARISON:  None. FINDINGS: The heart size and mediastinal contours are within normal limits. Both lungs are clear. The visualized skeletal structures are unremarkable. IMPRESSION: No active cardiopulmonary disease. Electronically Signed   By: Charlett Nose M.D.   On: 03/11/2016 11:59   Dg Lumbar Spine 2-3 Views  03/18/2016  CLINICAL DATA:  Portable lumbar spine imaging for surgical localization. EXAM: LUMBAR SPINE - 2-3 VIEW COMPARISON:  Lumbar MRI, 02/20/2016 FINDINGS: Initial image shows placement of a surgical needle with its tip projecting posterior to the posterior margin of the L5-S1 disc. Subsequent image shows placement surgical probe. Its tip  projects 2.2 cm posterior to the posterior margin of the L5-S1 disc. IMPRESSION: Surgical localization imaging as described Electronically Signed   By: Amie Portland M.D.   On: 03/18/2016 17:16   Mr Lumbar Spine Wo Contrast  02/21/2016  CLINICAL DATA:  Low back pain that radiates to both legs. Worse on the RIGHT. Symptoms for 6 months. EXAM: MRI LUMBAR SPINE WITHOUT CONTRAST TECHNIQUE: Multiplanar, multisequence MR imaging of the lumbar spine was performed. No intravenous contrast was administered. COMPARISON:  07/24/2015. FINDINGS: Segmentation: Normal. Alignment:  Normal. Vertebrae: No worrisome osseous lesion.Endplate reactive changes accompany disc space narrowing at L5-S1. Conus medullaris: Normal in size, signal, and location. Paraspinal tissues: No evidence for hydronephrosis or paravertebral mass. Disc levels: L1-L2:  Normal. L2-L3:  Normal. L3-L4: Foraminal and extraforaminal protrusion on the LEFT. No significant foraminal narrowing, but the LEFT L3 nerve root could be irritated in the extraforaminal compartment. This is similar to priors. L4-L5: Foraminal and extraforaminal protrusion on the LEFT. No significant foraminal narrowing, of the LEFT L4 nerve root could be irritated in the extraforaminal compartment. This is similar to priors. L5-S1: Central and rightward extrusion with caudal migration. Associated epidural hematoma, noted in 2016 scan, has regressed. There continues to be mass effect on the axilla of the RIGHT S1 nerve root. See axial image 33 series 9. IMPRESSION: Improved central and rightward extrusion at L5-S1. Regression of associated epidural hematoma but continued mass effect on the RIGHT S1 nerve root. Extraforaminal protrusions at L3-4 and L4-5 on the LEFT, roughly stable. Electronically Signed   By: Elsie Stain M.D.   On: 02/21/2016 08:59        Discharge Instructions    Call MD / Call 911    Complete by:  As directed   If you experience chest pain or shortness of breath,  CALL 911 and be transported to the hospital emergency room.  If you develope a fever above 101 F, pus (white drainage) or increased drainage or redness at the wound, or calf pain, call your surgeon's office.     Constipation Prevention    Complete by:  As directed   Drink plenty of fluids.  Prune juice may be helpful.  You may use a stool softener, such as Colace (over the counter) 100 mg twice a day.  Use MiraLax (over the counter) for constipation as needed.     Diet - low sodium heart healthy    Complete by:  As directed      Discharge instructions    Complete by:  As directed   Ok to shower 5 days postop.  Do not apply any creams or ointments to incision.  Do not remove steri-strips.  Can use 4x4 gauze and tape for dressing changes.  No aggressive activity.  No bending, squatting, twisting or prolonged sitting.  Mostly be in reclined position or lying down.  Driving restrictions    Complete by:  As directed   No driving     Increase activity slowly as tolerated    Complete by:  As directed   Ok to do some walking around house.  Nothing too aggressive.     Lifting restrictions    Complete by:  As directed   No lifting           Follow-up Information    Schedule an appointment as soon as possible for a visit with Eldred Manges, MD.   Specialty:  Orthopedic Surgery   Why:  need return office visit one week postop   Contact information:   [address] [place] [address] [phone]       Discharge Plan:  discharge to home  Disposition:     Signed: Naida Sleight  03/21/2016, 9:15 AM

## 2016-07-01 ENCOUNTER — Encounter: Payer: Self-pay | Admitting: Adult Health

## 2016-07-01 ENCOUNTER — Ambulatory Visit (INDEPENDENT_AMBULATORY_CARE_PROVIDER_SITE_OTHER): Payer: 59 | Admitting: Adult Health

## 2016-07-01 ENCOUNTER — Ambulatory Visit: Payer: 59 | Admitting: Adult Health

## 2016-07-01 VITALS — BP 132/78 | Temp 98.2°F | Ht 76.0 in | Wt 224.4 lb

## 2016-07-01 DIAGNOSIS — Z7689 Persons encountering health services in other specified circumstances: Secondary | ICD-10-CM

## 2016-07-01 DIAGNOSIS — I10 Essential (primary) hypertension: Secondary | ICD-10-CM

## 2016-07-01 DIAGNOSIS — L309 Dermatitis, unspecified: Secondary | ICD-10-CM | POA: Diagnosis not present

## 2016-07-01 DIAGNOSIS — Z7189 Other specified counseling: Secondary | ICD-10-CM | POA: Diagnosis not present

## 2016-07-01 MED ORDER — TRIAMCINOLONE ACETONIDE 0.1 % EX CREA: 1.0000 "application " | TOPICAL_CREAM | Freq: Two times a day (BID) | CUTANEOUS | Status: DC

## 2016-07-01 NOTE — Patient Instructions (Signed)
It was great meeting you today!  Please follow up with me for your physical this fall.   You can also schedule an appointment to have the cyst removed.   If you need anything in the meantime, please let me know  I have sent Kenalog cream to the pharmacy, apply this twice a day

## 2016-07-01 NOTE — Progress Notes (Signed)
Patient presents to clinic today to establish care. He is a pleasant 40 year old male patient who  has a past medical history of PONV (postoperative nausea and vomiting) and Chronic lower back pain.  His last physical was " years ago"   Acute Concerns: Establish Care  Chronic Issues: Back pain  - Laminectomy/Microdiscectomy  - He reports that he has had very little issues s/p surgery.   Eczema - He has areas of Eczema on his left forearm and would like to get some steroid cream to help with this.   Hypertension  - Questionable boarderline. Has not been on medication for this. Today in the office it is BP: 132/78 mmHg    Health Maintenance: Dental -- Twice yearly  Vision -- None Immunizations -- UTD Colonoscopy -- Never had   Orthopedics - Dr. Ophelia CharterYates s/p Laminectomy   Past Medical History  Diagnosis Date  . PONV (postoperative nausea and vomiting)     "just w/my wisdom teeth" (03/18/2016)  . Chronic lower back pain     "hopefull not after this OR today" (03/18/2016)    Past Surgical History  Procedure Laterality Date  . Wisdom tooth extraction  1992  . Microdiscectomy lumbar Right 03/18/2016    L5-S1  . Back surgery    . Lumbar laminectomy/decompression microdiscectomy N/A 03/18/2016    Procedure: Right L5-S1 Microdiscectomy;  Surgeon: Eldred MangesMark C Yates, MD;  [facility];  Service: Orthopedics;  Laterality: N/A;    Current Outpatient Prescriptions on File Prior to Visit  Medication Sig Dispense Refill  . methocarbamol (ROBAXIN) 500 MG tablet Take 1 tablet (500 mg total) by mouth every 6 (six) hours as needed for muscle spasms. 60 tablet 0   No current facility-administered medications on file prior to visit.    No Known Allergies  Family History  Problem Relation Age of Onset  . Alcohol abuse Father   . Hyperlipidemia Father   . Diabetes Father     Social History   Social History  . Marital Status: Married    Spouse Name: N/A  . Number of Children:  N/A  . Years of Education: N/A   Occupational History  . Not on file.   Social History Main Topics  . Smoking status: Current Every Day Smoker -- 15 years    Types: E-cigarettes, Cigarettes  . Smokeless tobacco: Never Used     Comment: 03/18/2016 "I use an electronic cigarette"  . Alcohol Use: No  . Drug Use: No  . Sexual Activity: Yes   Other Topics Concern  . Not on file   Social History Narrative    Review of Systems  Constitutional: Negative.   HENT: Negative.   Eyes: Negative.   Respiratory: Negative.   Cardiovascular: Negative.   Gastrointestinal: Negative.   Genitourinary: Negative.   Musculoskeletal: Negative.   Skin: Negative.   Neurological: Negative.   Psychiatric/Behavioral: Negative.     BP 132/78 mmHg  Temp(Src) 98.2 F (36.8 C) (Oral)  Ht 6\' 4"  (1.93 m)  Wt 224 lb 6.4 oz (101.787 kg)  BMI 27.33 kg/m2  Physical Exam  Constitutional: He is oriented to person, place, and time and well-developed, well-nourished, and in no distress. No distress.  HENT:  Head: Normocephalic and atraumatic.  Right Ear: External ear normal.  Left Ear: External ear normal.  Nose: Nose normal.  Mouth/Throat: Oropharynx is clear and moist. No oropharyngeal exudate.  Eyes: Conjunctivae and EOM are normal. Pupils are equal, round, and reactive to light.  Right eye exhibits no discharge. Left eye exhibits no discharge.  Neck: Normal range of motion. Neck supple. No tracheal deviation present. No thyromegaly present.  Cardiovascular: Normal rate, regular rhythm, normal heart sounds and intact distal pulses.  Exam reveals no gallop and no friction rub.   No murmur heard. Pulmonary/Chest: Effort normal and breath sounds normal. No respiratory distress. He has no wheezes. He has no rales. He exhibits no tenderness.  Lymphadenopathy:    He has no cervical adenopathy.  Neurological: He is alert and oriented to person, place, and time. He has normal reflexes. Gait normal. GCS score is  15.  Skin: Skin is warm and dry. No rash noted. He is not diaphoretic. No erythema. No pallor.  Psychiatric: Mood, memory, affect and judgment normal.  Nursing note and vitals reviewed.    Assessment/Plan: 1. Encounter to establish care Follow up for CPE or sooner with any acute issues - Increase exercise as tolerated - Healthy diet  2. Essential hypertension - Continue to monitor. He is near goal. I am hopeful that with increased exercise and healthy eating that it will fall into goal range  3. Eczema - triamcinolone cream (KENALOG) 0.1 %; Apply 1 application topically 2 (two) times daily.  Dispense: 30 g; Refill: 3  Shirline Freesory Esgar Barnick, NP

## 2016-07-03 ENCOUNTER — Encounter: Payer: Self-pay | Admitting: Adult Health

## 2016-08-31 ENCOUNTER — Other Ambulatory Visit (INDEPENDENT_AMBULATORY_CARE_PROVIDER_SITE_OTHER): Payer: 59

## 2016-08-31 DIAGNOSIS — Z0000 Encounter for general adult medical examination without abnormal findings: Secondary | ICD-10-CM

## 2016-08-31 LAB — POC URINALSYSI DIPSTICK (AUTOMATED)
Bilirubin, UA: NEGATIVE
Glucose, UA: NEGATIVE
KETONES UA: NEGATIVE
LEUKOCYTES UA: NEGATIVE
Nitrite, UA: NEGATIVE
PH UA: 6
PROTEIN UA: NEGATIVE
SPEC GRAV UA: 1.02
Urobilinogen, UA: 0.2

## 2016-08-31 LAB — BASIC METABOLIC PANEL
BUN: 17 mg/dL (ref 6–23)
CO2: 27 mEq/L (ref 19–32)
Calcium: 9.4 mg/dL (ref 8.4–10.5)
Chloride: 105 mEq/L (ref 96–112)
Creatinine, Ser: 1.03 mg/dL (ref 0.40–1.50)
GFR: 83.96 mL/min (ref >60.00)
Glucose, Bld: 85 mg/dL (ref 70–99)
POTASSIUM: 3.9 meq/L (ref 3.5–5.1)
SODIUM: 141 meq/L (ref 135–145)

## 2016-08-31 LAB — LIPID PANEL
CHOL/HDL RATIO: 4
CHOLESTEROL: 208 mg/dL — AB (ref 0–200)
HDL: 49.1 mg/dL (ref >39.00)
LDL CALC: 146 mg/dL — AB (ref 0–99)
NonHDL: 158.82
Triglycerides: 63 mg/dL (ref 0.0–149.0)
VLDL: 12.6 mg/dL (ref 0.0–40.0)

## 2016-08-31 LAB — HEPATIC FUNCTION PANEL
ALT: 29 U/L (ref 0–53)
AST: 25 U/L (ref 0–37)
Albumin: 4.6 g/dL (ref 3.5–5.2)
Alkaline Phosphatase: 60 U/L (ref 39–117)
BILIRUBIN TOTAL: 0.5 mg/dL (ref 0.2–1.2)
Bilirubin, Direct: 0.1 mg/dL (ref 0.0–0.3)
Total Protein: 7.7 g/dL (ref 6.0–8.3)

## 2016-08-31 LAB — CBC WITH DIFFERENTIAL/PLATELET
Basophils Absolute: 0 10*3/uL (ref 0.0–0.1)
Basophils Relative: 0.6 % (ref 0.0–3.0)
EOS PCT: 2.7 % (ref 0.0–5.0)
Eosinophils Absolute: 0.2 10*3/uL (ref 0.0–0.7)
HEMATOCRIT: 47.2 % (ref 39.0–52.0)
HEMOGLOBIN: 16.4 g/dL (ref 13.0–17.0)
LYMPHS PCT: 32.8 % (ref 12.0–46.0)
Lymphs Abs: 2.3 10*3/uL (ref 0.7–4.0)
MCHC: 34.7 g/dL (ref 30.0–36.0)
MCV: 89.7 fl (ref 78.0–100.0)
MONO ABS: 0.5 10*3/uL (ref 0.1–1.0)
MONOS PCT: 7.9 % (ref 3.0–12.0)
Neutro Abs: 3.9 10*3/uL (ref 1.4–7.7)
Neutrophils Relative %: 56 % (ref 43.0–77.0)
Platelets: 203 10*3/uL (ref 150.0–400.0)
RBC: 5.26 Mil/uL (ref 4.22–5.81)
RDW: 13.2 % (ref 11.5–15.5)
WBC: 6.9 10*3/uL (ref 4.0–10.5)

## 2016-08-31 LAB — TSH: TSH: 1.52 u[IU]/mL (ref 0.35–4.50)

## 2016-09-07 ENCOUNTER — Encounter: Payer: Self-pay | Admitting: Adult Health

## 2016-09-07 ENCOUNTER — Ambulatory Visit (INDEPENDENT_AMBULATORY_CARE_PROVIDER_SITE_OTHER): Payer: 59 | Admitting: Adult Health

## 2016-09-07 VITALS — BP 124/72 | Temp 98.6°F | Ht 76.0 in | Wt 219.6 lb

## 2016-09-07 DIAGNOSIS — Z9889 Other specified postprocedural states: Secondary | ICD-10-CM | POA: Diagnosis not present

## 2016-09-07 DIAGNOSIS — Z23 Encounter for immunization: Secondary | ICD-10-CM | POA: Diagnosis not present

## 2016-09-07 DIAGNOSIS — E785 Hyperlipidemia, unspecified: Secondary | ICD-10-CM | POA: Diagnosis not present

## 2016-09-07 DIAGNOSIS — Z0000 Encounter for general adult medical examination without abnormal findings: Secondary | ICD-10-CM | POA: Diagnosis not present

## 2016-09-07 MED ORDER — METHOCARBAMOL 500 MG PO TABS: 500.0000 mg | ORAL_TABLET | Freq: Four times a day (QID) | ORAL | 0 refills | Status: DC | PRN

## 2016-09-07 MED ORDER — MELOXICAM 15 MG PO TABS: 15.0000 mg | ORAL_TABLET | Freq: Every day | ORAL | 0 refills | Status: DC

## 2016-09-07 NOTE — Patient Instructions (Signed)
It was great seeing you today!  Your exam was great! Cholesterol level was a little elevated but nothing that changes in diet and exercise won't fix.   I have sent in a new prescription for Robaxin and then for a prescription for Mobic. Take Mobic instead of Motrin.   Health Maintenance, Male A healthy lifestyle and preventative care can promote health and wellness.  Maintain regular health, dental, and eye exams.  Eat a healthy diet. Foods like vegetables, fruits, whole grains, low-fat dairy products, and lean protein foods contain the nutrients you need and are low in calories. Decrease your intake of foods high in solid fats, added sugars, and salt. Get information about a proper diet from your health care provider, if necessary.  Regular physical exercise is one of the most important things you can do for your health. Most adults should get at least 150 minutes of moderate-intensity exercise (any activity that increases your heart rate and causes you to sweat) each week. In addition, most adults need muscle-strengthening exercises on 2 or more days a week.   Maintain a healthy weight. The body mass index (BMI) is a screening tool to identify possible weight problems. It provides an estimate of body fat based on height and weight. Your health care provider can find your BMI and can help you achieve or maintain a healthy weight. For males 20 years and older:  A BMI below 18.5 is considered underweight.  A BMI of 18.5 to 24.9 is normal.  A BMI of 25 to 29.9 is considered overweight.  A BMI of 30 and above is considered obese.  Maintain normal blood lipids and cholesterol by exercising and minimizing your intake of saturated fat. Eat a balanced diet with plenty of fruits and vegetables. Blood tests for lipids and cholesterol should begin at age 42 and be repeated every 5 years. If your lipid or cholesterol levels are high, you are over age 42, or you are at high risk for heart disease, you  may need your cholesterol levels checked more frequently.Ongoing high lipid and cholesterol levels should be treated with medicines if diet and exercise are not working.  If you smoke, find out from your health care provider how to quit. If you do not use tobacco, do not start.  Lung cancer screening is recommended for adults aged 55-80 years who are at high risk for developing lung cancer because of a history of smoking. A yearly low-dose CT scan of the lungs is recommended for people who have at least a 30-pack-year history of smoking and are current smokers or have quit within the past 15 years. A pack year of smoking is smoking an average of 1 pack of cigarettes a day for 1 year (for example, a 30-pack-year history of smoking could mean smoking 1 pack a day for 30 years or 2 packs a day for 15 years). Yearly screening should continue until the smoker has stopped smoking for at least 15 years. Yearly screening should be stopped for people who develop a health problem that would prevent them from having lung cancer treatment.  If you choose to drink alcohol, do not have more than 2 drinks per day. One drink is considered to be 12 oz (360 mL) of beer, 5 oz (150 mL) of wine, or 1.5 oz (45 mL) of liquor.  Avoid the use of street drugs. Do not share needles with anyone. Ask for help if you need support or instructions about stopping the use of drugs.  High blood pressure causes heart disease and increases the risk of stroke. High blood pressure is more likely to develop in:  People who have blood pressure in the end of the normal range (100-139/85-89 mm Hg).  People who are overweight or obese.  People who are African American.  If you are 85-53 years of age, have your blood pressure checked every 3-5 years. If you are 56 years of age or older, have your blood pressure checked every year. You should have your blood pressure measured twice--once when you are at a hospital or clinic, and once when you  are not at a hospital or clinic. Record the average of the two measurements. To check your blood pressure when you are not at a hospital or clinic, you can use:  An automated blood pressure machine at a pharmacy.  A home blood pressure monitor.  If you are 47-53 years old, ask your health care provider if you should take aspirin to prevent heart disease.  Diabetes screening involves taking a blood sample to check your fasting blood sugar level. This should be done once every 3 years after age 46 if you are at a normal weight and without risk factors for diabetes. Testing should be considered at a younger age or be carried out more frequently if you are overweight and have at least 1 risk factor for diabetes.  Colorectal cancer can be detected and often prevented. Most routine colorectal cancer screening begins at the age of 46 and continues through age 66. However, your health care provider may recommend screening at an earlier age if you have risk factors for colon cancer. On a yearly basis, your health care provider may provide home test kits to check for hidden blood in the stool. A small camera at the end of a tube may be used to directly examine the colon (sigmoidoscopy or colonoscopy) to detect the earliest forms of colorectal cancer. Talk to your health care provider about this at age 2 when routine screening begins. A direct exam of the colon should be repeated every 5-10 years through age 26, unless early forms of precancerous polyps or small growths are found.  People who are at an increased risk for hepatitis B should be screened for this virus. You are considered at high risk for hepatitis B if:  You were born in a country where hepatitis B occurs often. Talk with your health care provider about which countries are considered high risk.  Your parents were born in a high-risk country and you have not received a shot to protect against hepatitis B (hepatitis B vaccine).  You have HIV or  AIDS.  You use needles to inject street drugs.  You live with, or have sex with, someone who has hepatitis B.  You are a man who has sex with other men (MSM).  You get hemodialysis treatment.  You take certain medicines for conditions like cancer, organ transplantation, and autoimmune conditions.  Hepatitis C blood testing is recommended for all people born from [birth year] through 1967 and any individual with known risk factors for hepatitis C.  Healthy men should no longer receive prostate-specific antigen (PSA) blood tests as part of routine cancer screening. Talk to your health care provider about prostate cancer screening.  Testicular cancer screening is not recommended for adolescents or adult males who have no symptoms. Screening includes self-exam, a health care provider exam, and other screening tests. Consult with your health care provider about any symptoms you have or any  concerns you have about testicular cancer.  Practice safe sex. Use condoms and avoid high-risk sexual practices to reduce the spread of sexually transmitted infections (STIs).  You should be screened for STIs, including gonorrhea and chlamydia if:  You are sexually active and are younger than 24 years.  You are older than 24 years, and your health care provider tells you that you are at risk for this type of infection.  Your sexual activity has changed since you were last screened, and you are at an increased risk for chlamydia or gonorrhea. Ask your health care provider if you are at risk.  If you are at risk of being infected with HIV, it is recommended that you take a prescription medicine daily to prevent HIV infection. This is called pre-exposure prophylaxis (PrEP). You are considered at risk if:  You are a man who has sex with other men (MSM).  You are a heterosexual man who is sexually active with multiple partners.  You take drugs by injection.  You are sexually active with a partner who has  HIV.  Talk with your health care provider about whether you are at high risk of being infected with HIV. If you choose to begin PrEP, you should first be tested for HIV. You should then be tested every 3 months for as long as you are taking PrEP.  Use sunscreen. Apply sunscreen liberally and repeatedly throughout the day. You should seek shade when your shadow is shorter than you. Protect yourself by wearing long sleeves, pants, a wide-brimmed hat, and sunglasses year round whenever you are outdoors.  Tell your health care provider of new moles or changes in moles, especially if there is a change in shape or color. Also, tell your health care provider if a mole is larger than the size of a pencil eraser.  A one-time screening for abdominal aortic aneurysm (AAA) and surgical repair of large AAAs by ultrasound is recommended for men aged 61-75 years who are current or former smokers.  Stay current with your vaccines (immunizations).   This information is not intended to replace advice given to you by your health care provider. Make sure you discuss any questions you have with your health care provider.   Document Released: 06/09/2008 Document Revised: 01/02/2015 Document Reviewed: 05/09/2011 Elsevier Interactive Patient Education [employer].

## 2016-09-07 NOTE — Progress Notes (Signed)
Subjective:    Patient ID: David Salazar, male    DOB: 19-Apr-1976, 40 y.o.   MRN: [id number]  HPI  Patient presents for yearly preventative medicine examination. He is a pleasant 40 year old male who  has a past medical history of Alcohol abuse; Chronic lower back pain; and PONV (postoperative nausea and vomiting).   All immunizations and health maintenance protocols were reviewed with the patient and needed orders were placed.  Medication reconciliation,  past medical history, social history, problem list and allergies were reviewed in detail with the patient  Goals were established with regard to weight loss, exercise, and  diet in compliance with medications. He is trying to eat healthy but is not exercising as much as he would like. He currently walks a few times per week   He is up to date on dental and eye exams. Has a follow up appointment with orthopedic surgery next week for back pain. He has been taking 600mg  Motrin 3-4 times per day.   He has no acute issue he would like to talk about   Review of Systems  Constitutional: Negative.   HENT: Negative.   Eyes: Negative.   Respiratory: Negative.   Cardiovascular: Negative.   Gastrointestinal: Negative.   Endocrine: Negative.   Genitourinary: Negative.   Musculoskeletal: Positive for arthralgias and back pain. Negative for gait problem, joint swelling, myalgias, neck pain and neck stiffness.  Skin: Negative.   Allergic/Immunologic: Negative.   Neurological: Negative.   Hematological: Negative.   Psychiatric/Behavioral: Negative.   All other systems reviewed and are negative.  Past Medical History:  Diagnosis Date  . Alcohol abuse    Sober for 7 years ( 2017).   . Chronic lower back pain    "hopefull not after this OR today" (03/18/2016)  . PONV (postoperative nausea and vomiting)    "just w/my wisdom teeth" (03/18/2016)    Social History   Social History  . Marital status: Married    Spouse name: N/A  .  Number of children: N/A  . Years of education: N/A   Occupational History  . Not on file.   Social History Main Topics  . Smoking status: Current Every Day Smoker    Years: 15.00    Types: E-cigarettes, Cigarettes  . Smokeless tobacco: Never Used     Comment: 03/18/2016 "I use an electronic cigarette"  . Alcohol use No  . Drug use: No  . Sexual activity: Yes   Other Topics Concern  . Not on file   Social History Narrative  . No narrative on file    Past Surgical History:  Procedure Laterality Date  . BACK SURGERY    . LUMBAR LAMINECTOMY/DECOMPRESSION MICRODISCECTOMY N/A 03/18/2016   Procedure: Right L5-S1 Microdiscectomy;  Surgeon: Eldred Manges, MD;  Location: [facility] OR;  Service: Orthopedics;  Laterality: N/A;  . MICRODISCECTOMY LUMBAR Right 03/18/2016   L5-S1  . WISDOM TOOTH EXTRACTION  1992    Family History  Problem Relation Age of Onset  . Alcohol abuse Father   . Hyperlipidemia Father   . Diabetes Father     No Known Allergies  Current Outpatient Prescriptions on File Prior to Visit  Medication Sig Dispense Refill  . triamcinolone cream (KENALOG) 0.1 % Apply 1 application topically 2 (two) times daily. 30 g 3   No current facility-administered medications on file prior to visit.     BP 124/72   Temp 98.6 F (37 C) (Oral)   Ht 6\' 4"  (  1.93 m)   Wt 219 lb 9.6 oz (99.6 kg)   BMI 26.73 kg/m       Objective:   Physical Exam  Constitutional: He is oriented to person, place, and time. He appears well-developed and well-nourished. No distress.  HENT:  Head: Normocephalic and atraumatic.  Right Ear: External ear normal.  Left Ear: External ear normal.  Nose: Nose normal.  Mouth/Throat: Oropharynx is clear and moist. No oropharyngeal exudate.  Eyes: Conjunctivae and EOM are normal. Pupils are equal, round, and reactive to light. Right eye exhibits no discharge. Left eye exhibits no discharge. No scleral icterus.  Neck: Normal range of motion. Neck supple. No  JVD present. No tracheal deviation present. No thyromegaly present.  Cardiovascular: Normal rate, regular rhythm, normal heart sounds and intact distal pulses.  Exam reveals no gallop and no friction rub.   No murmur heard. Pulmonary/Chest: Effort normal and breath sounds normal. No stridor. No respiratory distress. He has no wheezes. He has no rales. He exhibits no tenderness.  Abdominal: Soft. Bowel sounds are normal. He exhibits no distension and no mass. There is no tenderness. There is no rebound and no guarding.  Genitourinary:  Genitourinary Comments: Deferred  Musculoskeletal: Normal range of motion. He exhibits no edema, tenderness or deformity.  Lymphadenopathy:    He has no cervical adenopathy.  Neurological: He is alert and oriented to person, place, and time. He has normal reflexes. He displays normal reflexes. No cranial nerve deficit. He exhibits normal muscle tone. Coordination normal.  Skin: Skin is warm and dry. No rash noted. He is not diaphoretic. No erythema. No pallor.  Scattered moles. No malignancy concern at this time  Large cyst on left side of back of head. No tenderness  Psychiatric: He has a normal mood and affect. His behavior is normal. Judgment and thought content normal.  Nursing note and vitals reviewed.     Assessment & Plan:  1. Routine general medical examination at a health care facility - Reviewed labs with patient in detail and all questions answered - Encouraged healthy eating and regular exercise - Follow up in one year or sooner if needed  2. Need for prophylactic vaccination and inoculation against influenza  - Flu Vaccine QUAD 36+ mos PF IM (Fluarix & Fluzone Quad PF)  3. Hyperlipidemia - Still elevated - Encouraged heart healthy diet and regular exercise - Will continue to monitor - No need to start statin at this time  4. S/P lumbar discectomy - Stop motrin  - meloxicam (MOBIC) 15 MG tablet; Take 1 tablet (15 mg total) by mouth daily.   Dispense: 30 tablet; Refill: 0 - methocarbamol (ROBAXIN) 500 MG tablet; Take 1 tablet (500 mg total) by mouth every 6 (six) hours as needed for muscle spasms.  Dispense: 60 tablet; Refill: 0   Shirline Freesory Niti Leisure, NP

## 2016-09-20 ENCOUNTER — Other Ambulatory Visit (INDEPENDENT_AMBULATORY_CARE_PROVIDER_SITE_OTHER): Payer: Self-pay | Admitting: Orthopaedic Surgery

## 2016-09-20 DIAGNOSIS — M5126 Other intervertebral disc displacement, lumbar region: Secondary | ICD-10-CM

## 2016-09-21 ENCOUNTER — Encounter: Payer: Self-pay | Admitting: Adult Health

## 2016-09-21 ENCOUNTER — Ambulatory Visit (INDEPENDENT_AMBULATORY_CARE_PROVIDER_SITE_OTHER): Payer: 59 | Admitting: Adult Health

## 2016-09-21 VITALS — BP 106/70 | Temp 98.6°F | Ht 76.0 in | Wt 218.4 lb

## 2016-09-21 DIAGNOSIS — M545 Low back pain: Secondary | ICD-10-CM

## 2016-09-21 DIAGNOSIS — L723 Sebaceous cyst: Secondary | ICD-10-CM | POA: Diagnosis not present

## 2016-09-21 MED ORDER — TRAMADOL HCL 50 MG PO TABS: 50.0000 mg | ORAL_TABLET | Freq: Three times a day (TID) | ORAL | 0 refills | Status: DC | PRN

## 2016-09-21 NOTE — Progress Notes (Signed)
Subjective:    Patient ID: David Salazar, male    DOB: Jul 13, 1976, 40 y.o.   MRN: [id number]  HPI  39 year old male who presents to the office today for removal of sebaceous cyst from the back right of his head. He denies any pain, redness, warmth or drainage. He has had this cyst for " a lot of years".   He is also complaining of worsening back pain. He has a hx of Laminectomy/Microdiscectomy and has had very little pain since surgery. Today in the office he reports " it just acts up sometime." he has been using motrin and tylenol without relief. Has MRI scheduled for Friday. Denies any numbness or tingling in his lower extremity  Review of Systems  Constitutional: Positive for activity change.  Musculoskeletal: Positive for arthralgias and back pain.  Skin:       Cyst    Past Medical History:  Diagnosis Date  . Alcohol abuse    Sober for 7 years ( 2017).   . Chronic lower back pain    "hopefull not after this OR today" (03/18/2016)  . PONV (postoperative nausea and vomiting)    "just w/my wisdom teeth" (03/18/2016)    Social History   Social History  . Marital status: Married    Spouse name: N/A  . Number of children: N/A  . Years of education: N/A   Occupational History  . Not on file.   Social History Main Topics  . Smoking status: Current Every Day Smoker    Years: 15.00    Types: E-cigarettes, Cigarettes  . Smokeless tobacco: Never Used     Comment: 03/18/2016 "I use an electronic cigarette"  . Alcohol use No  . Drug use: No  . Sexual activity: Yes   Other Topics Concern  . Not on file   Social History Narrative  . No narrative on file    Past Surgical History:  Procedure Laterality Date  . BACK SURGERY    . LUMBAR LAMINECTOMY/DECOMPRESSION MICRODISCECTOMY N/A 03/18/2016   Procedure: Right L5-S1 Microdiscectomy;  Surgeon: Eldred Manges, MD;  Location: [facility] OR;  Service: Orthopedics;  Laterality: N/A;  . MICRODISCECTOMY LUMBAR Right 03/18/2016   L5-S1  . WISDOM TOOTH EXTRACTION  1992    Family History  Problem Relation Age of Onset  . Alcohol abuse Father   . Hyperlipidemia Father   . Diabetes Father     No Known Allergies  Current Outpatient Prescriptions on File Prior to Visit  Medication Sig Dispense Refill  . meloxicam (MOBIC) 15 MG tablet Take 1 tablet (15 mg total) by mouth daily. 30 tablet 0  . methocarbamol (ROBAXIN) 500 MG tablet Take 1 tablet (500 mg total) by mouth every 6 (six) hours as needed for muscle spasms. 60 tablet 0  . triamcinolone cream (KENALOG) 0.1 % Apply 1 application topically 2 (two) times daily. 30 g 3   No current facility-administered medications on file prior to visit.     BP 106/70   Temp 98.6 F (37 C) (Oral)   Ht 6\' 4"  (1.93 m)   Wt 218 lb 6.4 oz (99.1 kg)   BMI 26.58 kg/m       Objective:   Physical Exam  Constitutional: He is oriented to person, place, and time. He appears well-developed and well-nourished. No distress.  Cardiovascular:  No murmur heard. Pulmonary/Chest: Effort normal and breath sounds normal.  Musculoskeletal: Normal range of motion. He exhibits no edema or tenderness.  Has trouble getting  out of chair and onto exam table. He appears in a lot of pain   Neurological: He is alert and oriented to person, place, and time.  Skin: Skin is warm and dry. No rash noted. He is not diaphoretic. No erythema. No pallor.  Large ( quarter sized) Sebaceuous cyst on the back of the head ( right side)   Psychiatric: He has a normal mood and affect. His behavior is normal. Judgment and thought content normal.  Nursing note and vitals reviewed.     Assessment & Plan:  1. Sebaceous cyst Removal of Cyst Date/Time: 09/21/2016 Performed by: Shirline Freesory Chelsey Redondo, NP Authorized by: Shirline Freesory Maicie Vanderloop, NP Consent:    Consent obtained:  Written   Consent given by:  Patient   Risks discussed:  Infection, pain, poor cosmetic result, need for additional repair, nerve damage and vascular  damage   Alternatives discussed:  No treatment, delayed treatment and referral Anesthesia (see MAR for exact dosages):    Anesthesia method:  Local infiltration   Local anesthetic:  Lidocaine 1% WITH epi Cyst details:    Location:  Right side, back of head   Shoulder/arm location:  Pre-procedure details:    Preparation:  Patient was prepped and draped in usual sterile fashion Exploration:    Hemostasis achieved with:  Epinephrine and direct pressure   Wound exploration: Sac removed without much difficulty wound explored  Treatment:    Area cleansed with:  Betadine and alcohol   Amount of cleaning:  Standard   Visualized foreign bodies/material removed: no   Skin repair:    Repair method:  Surgiglue Approximation:    Approximation:  Close Post-procedure details:    Patient tolerance of procedure:  Tolerated well, no immediate complications - Advised to monitor for signs of infection  2. Bilateral low back pain, with sciatica presence unspecified - traMADol (ULTRAM) 50 MG tablet; Take 1 tablet (50 mg total) by mouth every 8 (eight) hours as needed.  Dispense: 30 tablet; Refill: 0 - Follow up with surgery as needed  Shirline Freesory Sheilyn Boehlke, NP

## 2016-09-21 NOTE — Patient Instructions (Signed)
It was great seeing you again   Please allow the glue to fall off on it's own.   Monitor for any signs of infection ( redness, warmth, discharge) and follow up if you notice any.

## 2016-09-30 IMAGING — CR DG CHEST 2V
2 series · 2 of 2 positions shown · non-contrast
Comparison: None.

CLINICAL DATA: Lumbar HNP, preop

EXAM:
CHEST  2 VIEW

[w chest pa]
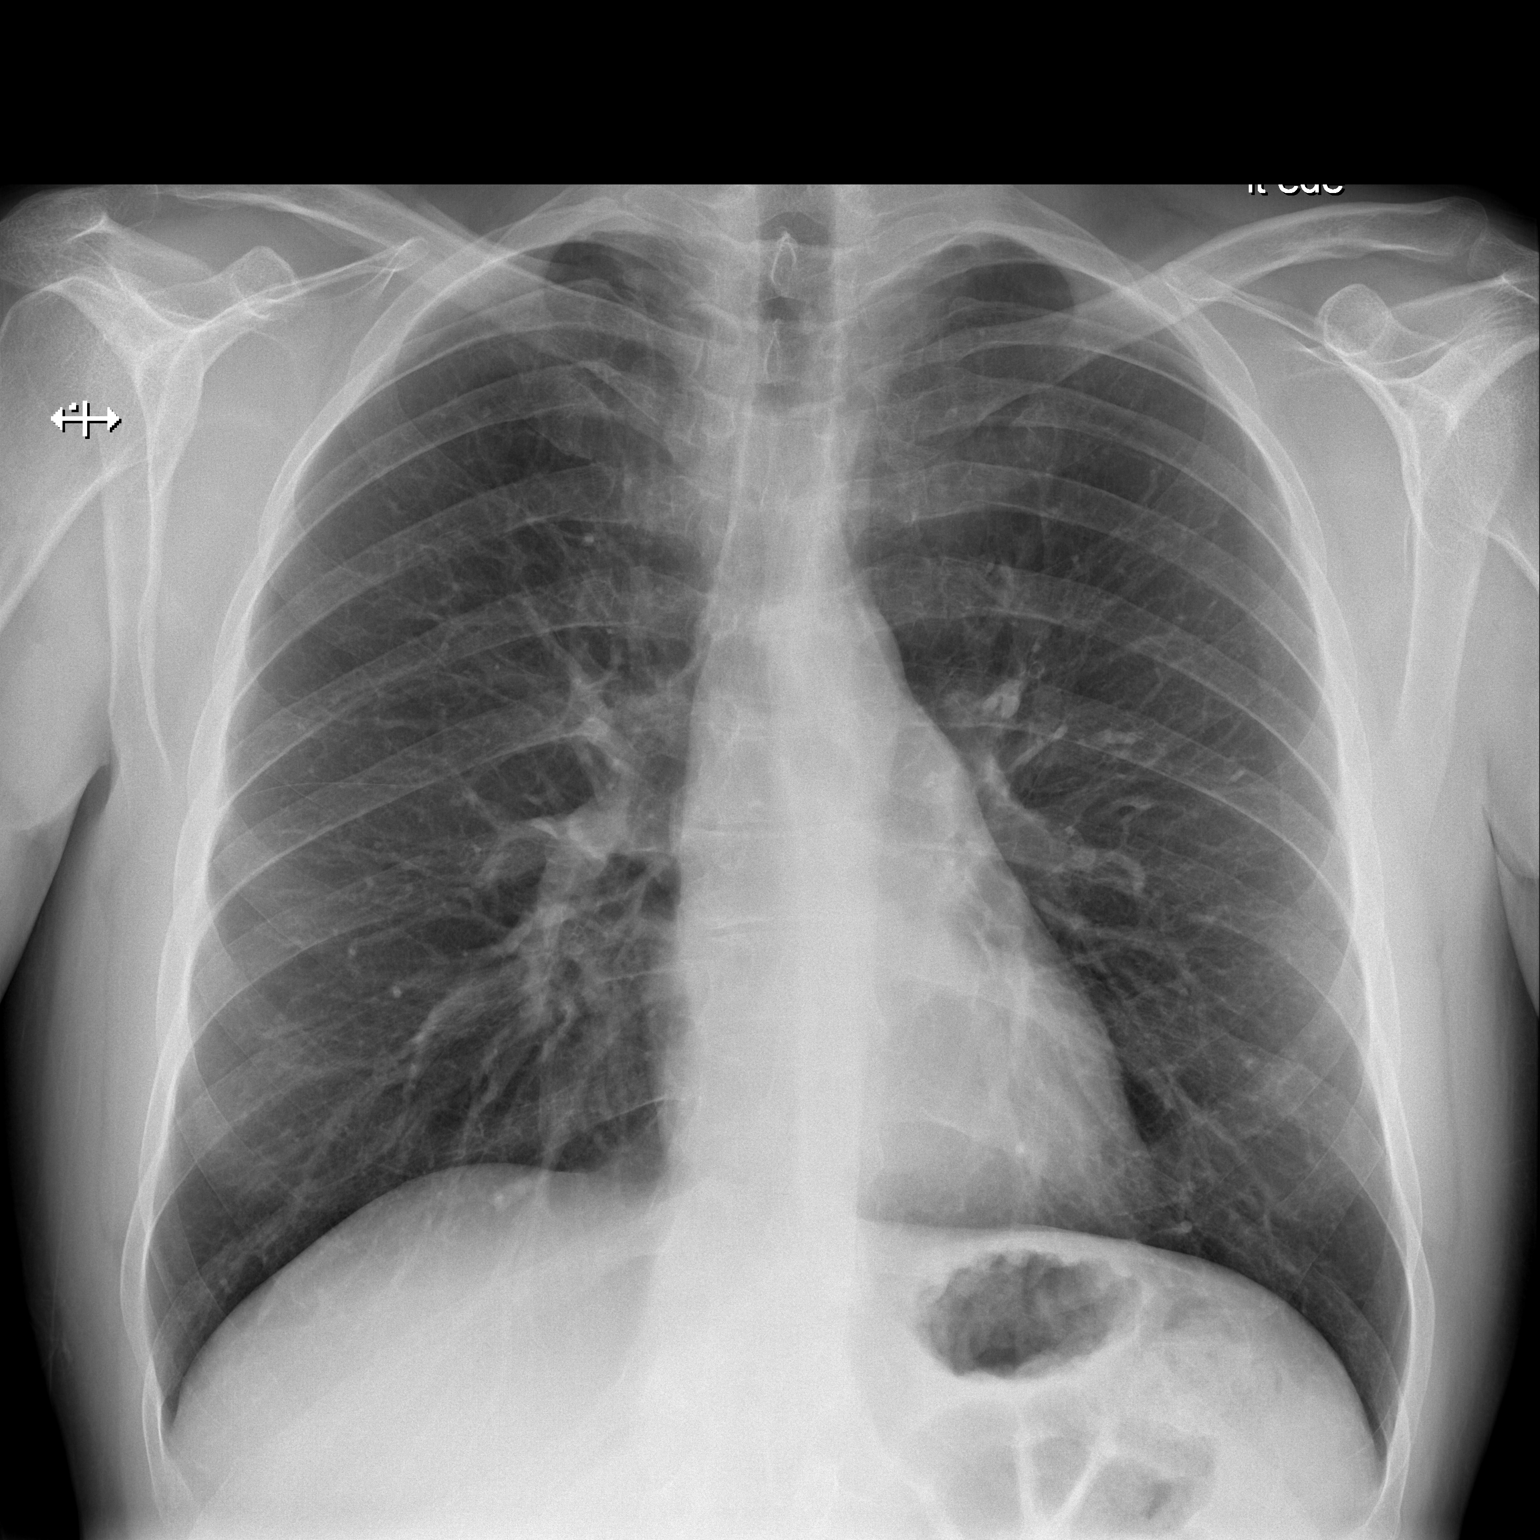

[w chest lat]
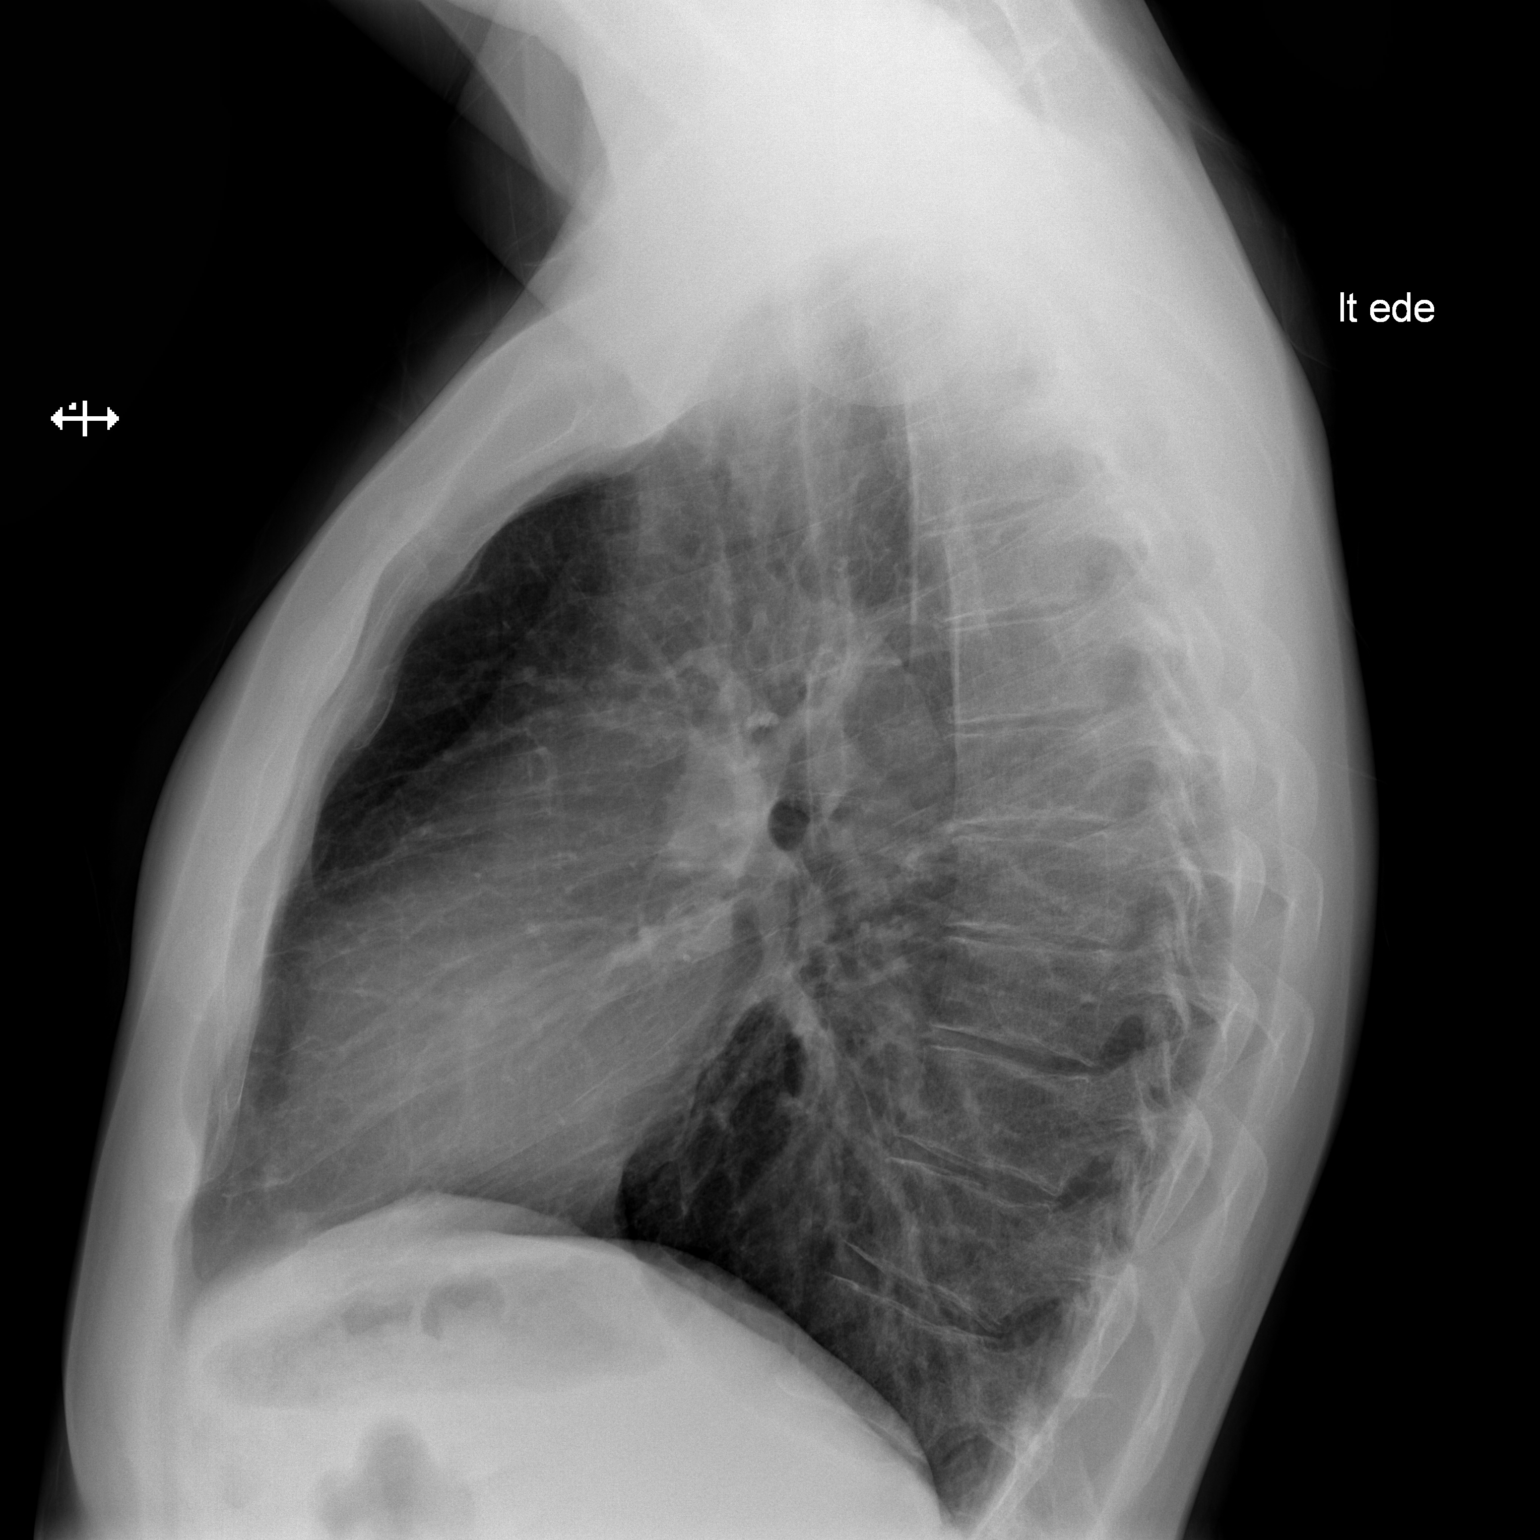

[2 of 2 positions shown; findings below may reference images not displayed]

FINDINGS: The heart size and mediastinal contours are within normal limits.
Both lungs are clear. The visualized skeletal structures are
unremarkable.
IMPRESSION: No active cardiopulmonary disease.

## 2016-10-01 ENCOUNTER — Ambulatory Visit
Admission: RE | Admit: 2016-10-01 | Discharge: 2016-10-01 | Disposition: A | Discharge status: Home / Self Care | Payer: 59 | Source: Ambulatory Visit | Attending: Orthopaedic Surgery | Admitting: Orthopaedic Surgery

## 2016-10-01 DIAGNOSIS — M5126 Other intervertebral disc displacement, lumbar region: Secondary | ICD-10-CM

## 2016-10-01 MED ORDER — GADOBENATE DIMEGLUMINE 529 MG/ML IV SOLN
20.0000 mL | Freq: Once | INTRAVENOUS | Status: AC | PRN
  Administered 2016-10-01: 19 mL via INTRAVENOUS

## 2016-10-04 ENCOUNTER — Ambulatory Visit (INDEPENDENT_AMBULATORY_CARE_PROVIDER_SITE_OTHER): Payer: 59 | Admitting: Orthopaedic Surgery

## 2016-10-04 DIAGNOSIS — M5116 Intervertebral disc disorders with radiculopathy, lumbar region: Secondary | ICD-10-CM | POA: Diagnosis not present

## 2016-10-04 DIAGNOSIS — M5416 Radiculopathy, lumbar region: Secondary | ICD-10-CM

## 2016-10-25 ENCOUNTER — Other Ambulatory Visit: Payer: Self-pay | Admitting: Adult Health

## 2016-10-25 NOTE — Telephone Encounter (Signed)
Ok to refill for one month  

## 2016-10-25 NOTE — Telephone Encounter (Signed)
Ok to refill 

## 2016-10-26 MED ORDER — TRAMADOL HCL 50 MG PO TABS: 50.0000 mg | ORAL_TABLET | Freq: Three times a day (TID) | ORAL | 0 refills | Status: DC | PRN

## 2016-10-26 NOTE — Telephone Encounter (Signed)
Rx called in as directed.   

## 2016-11-15 ENCOUNTER — Ambulatory Visit (INDEPENDENT_AMBULATORY_CARE_PROVIDER_SITE_OTHER): Payer: 59 | Admitting: Orthopaedic Surgery

## 2016-11-15 ENCOUNTER — Encounter (INDEPENDENT_AMBULATORY_CARE_PROVIDER_SITE_OTHER): Payer: Self-pay | Admitting: Orthopaedic Surgery

## 2016-11-15 VITALS — BP 117/83 | HR 76 | Ht 76.0 in | Wt 210.0 lb

## 2016-11-15 DIAGNOSIS — G8929 Other chronic pain: Secondary | ICD-10-CM

## 2016-11-15 DIAGNOSIS — M5441 Lumbago with sciatica, right side: Secondary | ICD-10-CM

## 2016-11-15 MED ORDER — TRAMADOL HCL 50 MG PO TABS: 50.0000 mg | ORAL_TABLET | Freq: Two times a day (BID) | ORAL | 0 refills | Status: DC | PRN

## 2016-11-15 NOTE — Patient Instructions (Signed)
If right lower extremity nerve pain worsens call our office to schedule nerve conduction velocity studies.

## 2016-11-15 NOTE — Progress Notes (Signed)
Office Visit Note   Patient: David Salazar           Date of Birth: Aug 12, 1976           MRN: [id number] Visit Date: 11/15/2016              Requested by: Shirline Freesory Nafziger, NP [address] [place], [place] [id number] PCP: Shirline Freesory Nafziger, NP   Assessment & Plan: Visit Diagnoses:  1. Chronic right-sided low back pain with right-sided sciatica    2  Question right peroneal entrapment neuropathy  Plan:   Follow-Up Instructions: Return in about 2 months (around 01/15/2017).   Orders:  No orders of the defined types were placed in this encounter.  Meds ordered this encounter  Medications  . traMADol (ULTRAM) 50 MG tablet    Sig: Take 1 tablet (50 mg total) by mouth every 12 (twelve) hours as needed for moderate pain.    Dispense:  90 tablet    Refill:  0      Procedures: No procedures performed   Clinical Data: No additional findings.   Subjective: Chief Complaint  Patient presents with  . Lower Back - Pain, Follow-up    Patient is here for follow up low back pain. He is status post Right L5-S1 microdiscectomy on 03/18/2016.  He states that the radiculopathy is only down to about the mid right calf.  He does have a deep ache in the middle of his back.  He is out of the Tramadol. That did help a lot.    States that most of his nerve pain. The level of the right fibular head extends down towards his foot and lateral calf. Also continues to complain of low back pain. Review of Systems  Constitutional: Negative.   Respiratory: Negative.   Cardiovascular: Negative.   Gastrointestinal: Negative.   Musculoskeletal: Positive for back pain.  Skin: Negative.   Psychiatric/Behavioral: Negative.      Objective: Vital Signs: BP 117/83   Pulse 76   Ht 6\' 4"  (1.93 m)   Wt 210 lb (95.3 kg)   BMI 25.56 kg/m   Physical Exam  Constitutional: He is oriented to person, place, and time. No distress.  HENT:  Head: Normocephalic.  Eyes: Pupils are equal, round,  and reactive to light.  Musculoskeletal:  Gait is normal. Lumbar spine nontender. Negative logroll. Extremities. Mildly tender over the peroneal nerve about the level of the fibular head. Positive Tinel's over this area. Question trace right anterior tib weakness.  Neurological: He is alert and oriented to person, place, and time.    Ortho Exam  Specialty Comments:  No specialty comments available.  Imaging: No results found.   PMFS History: Patient Active Problem List   Diagnosis Date Noted  . S/P lumbar discectomy 03/18/2016  . Hyperlipidemia 10/27/2009  . ALCOHOL ABUSE 10/27/2009  . HYPERTENSION 10/27/2009   Past Medical History:  Diagnosis Date  . Alcohol abuse    Sober for 7 years ( 2017).   . Chronic lower back pain    "hopefull not after this OR today" (03/18/2016)  . PONV (postoperative nausea and vomiting)    "just w/my wisdom teeth" (03/18/2016)    Family History  Problem Relation Age of Onset  . Alcohol abuse Father   . Hyperlipidemia Father   . Diabetes Father     Past Surgical History:  Procedure Laterality Date  . BACK SURGERY    . LUMBAR LAMINECTOMY/DECOMPRESSION MICRODISCECTOMY N/A 03/18/2016   Procedure: Right L5-S1  Microdiscectomy;  Surgeon: Eldred MangesMark C Yates, MD;  [facility];  Service: Orthopedics;  Laterality: N/A;  . MICRODISCECTOMY LUMBAR Right 03/18/2016   L5-S1  . WISDOM TOOTH EXTRACTION  1992   Social History   Occupational History  . Not on file.   Social History Main Topics  . Smoking status: Current Every Day Smoker    Years: 15.00    Types: E-cigarettes, Cigarettes  . Smokeless tobacco: Never Used     Comment: 03/18/2016 "I use an electronic cigarette"  . Alcohol use No  . Drug use: No  . Sexual activity: Yes

## 2017-01-05 ENCOUNTER — Other Ambulatory Visit: Payer: Self-pay | Admitting: Adult Health

## 2017-01-06 MED ORDER — TRAMADOL HCL 50 MG PO TABS
50.0000 mg | ORAL_TABLET | Freq: Three times a day (TID) | ORAL | 0 refills | Status: DC | PRN
Start: 2017-01-06 — End: 2017-03-28

## 2017-01-06 NOTE — Telephone Encounter (Signed)
He will need to be sent to pain management for further refills or see his orthopedics doctor due to new prescribing rules.  I am willing to give him a week of meds

## 2017-01-06 NOTE — Telephone Encounter (Signed)
Ok to refill 

## 2017-01-17 ENCOUNTER — Encounter (INDEPENDENT_AMBULATORY_CARE_PROVIDER_SITE_OTHER): Payer: Self-pay | Admitting: Orthopaedic Surgery

## 2017-01-17 ENCOUNTER — Ambulatory Visit (INDEPENDENT_AMBULATORY_CARE_PROVIDER_SITE_OTHER): Payer: 59 | Admitting: Orthopaedic Surgery

## 2017-01-17 VITALS — BP 118/81 | HR 75 | Ht 76.0 in | Wt 200.0 lb

## 2017-01-17 DIAGNOSIS — Z9889 Other specified postprocedural states: Secondary | ICD-10-CM | POA: Diagnosis not present

## 2017-01-17 MED ORDER — TRAMADOL HCL 50 MG PO TABS: 50.0000 mg | ORAL_TABLET | Freq: Two times a day (BID) | ORAL | 0 refills | Status: DC | PRN

## 2017-01-17 NOTE — Progress Notes (Signed)
Office Visit Note   Patient: David Salazar           Date of Birth: 11-27-1976           MRN: [id number] Visit Date: 01/17/2017              Requested by: Shirline Frees, NP [address] [place], [place] [address] PCP: Shirline Frees, NP   Assessment & Plan: Visit Diagnoses:  1. S/P lumbar discectomy     Plan: Patient has post laminectomy syndrome with primarily back pain. Leg pains better he has some pain that radiates into his right hamstring. Postop MRI October 2017 which was was about 6 months after surgery showed some scar tissue around the nerve but no nerve root displacement and no evidence of recurrent disc herniation. He does have collapse of the 51 disc space and some endplate sclerotic changes. We'll seek approval for a single epidural to see if this helps settle down his symptoms. Fortunately patient's been back at work is not taking narcotics he is doing his exercises at home as instructed per the therapy protocol. Follow-up after epidural.  Follow-Up Instructions: No Follow-up on file.   Orders:  No orders of the defined types were placed in this encounter.  No orders of the defined types were placed in this encounter.     Procedures: No procedures performed   Clinical Data: No additional findings.   Subjective: Chief Complaint  Patient presents with  . Lower Back - Follow-up    Patient returns for two month follow up low back pain. He states that he is not having a lot of pain in the leg anymore but it feels like he has a backache all of the time. He tries to walk a lot. He does notice that he has increased pain if he tweaks it at all. He was trying to build something in his garage, and had increased pain. He takes ibuprofen for pain.    Review of Systems   Objective: Vital Signs: BP 118/81   Pulse 75   Ht 6\' 4"  (1.93 m)   Wt 200 lb (90.7 kg)   BMI 24.34 kg/m   Physical Exam  Ortho Exam  Specialty Comments:  No specialty  comments available.  Imaging: No results found.   PMFS History: Patient Active Problem List   Diagnosis Date Noted  . S/P lumbar discectomy 03/18/2016  . Hyperlipidemia 10/27/2009  . ALCOHOL ABUSE 10/27/2009  . HYPERTENSION 10/27/2009   Past Medical History:  Diagnosis Date  . Alcohol abuse    Sober for 7 years ( 2017).   . Chronic lower back pain    "hopefull not after this OR today" (03/18/2016)  . PONV (postoperative nausea and vomiting)    "just w/my wisdom teeth" (03/18/2016)    Family History  Problem Relation Age of Onset  . Alcohol abuse Father   . Hyperlipidemia Father   . Diabetes Father     Past Surgical History:  Procedure Laterality Date  . BACK SURGERY    . LUMBAR LAMINECTOMY/DECOMPRESSION MICRODISCECTOMY N/A 03/18/2016   Procedure: Right L5-S1 Microdiscectomy;  Surgeon: Eldred Manges, MD;  Location: [facility] OR;  Service: Orthopedics;  Laterality: N/A;  . MICRODISCECTOMY LUMBAR Right 03/18/2016   L5-S1  . WISDOM TOOTH EXTRACTION  1992   Social History   Occupational History  . Not on file.   Social History Main Topics  . Smoking status: Former Smoker    Years: 15.00    Types:  E-cigarettes, Cigarettes  . Smokeless tobacco: Never Used     Comment: 03/18/2016 "I use an electronic cigarette"  . Alcohol use No  . Drug use: No  . Sexual activity: Yes

## 2017-01-17 NOTE — Addendum Note (Signed)
Addended by: Rogers SeedsYEATTS, Keosha Rossa M on: 01/17/2017 04:10 PM   Modules accepted: Orders

## 2017-01-25 ENCOUNTER — Encounter (INDEPENDENT_AMBULATORY_CARE_PROVIDER_SITE_OTHER): Payer: Self-pay | Admitting: Physical Medicine and Rehabilitation

## 2017-01-25 ENCOUNTER — Ambulatory Visit (INDEPENDENT_AMBULATORY_CARE_PROVIDER_SITE_OTHER): Payer: 59 | Admitting: Physical Medicine and Rehabilitation

## 2017-01-25 ENCOUNTER — Ambulatory Visit (INDEPENDENT_AMBULATORY_CARE_PROVIDER_SITE_OTHER): Payer: Self-pay

## 2017-01-25 VITALS — BP 120/82 | HR 75

## 2017-01-25 DIAGNOSIS — M5416 Radiculopathy, lumbar region: Secondary | ICD-10-CM

## 2017-01-25 MED ORDER — METHYLPREDNISOLONE ACETATE 80 MG/ML IJ SUSP
80.0000 mg | Freq: Once | INTRAMUSCULAR | Status: AC
  Administered 2017-01-25: 80 mg

## 2017-01-25 MED ORDER — LIDOCAINE HCL (PF) 1 % IJ SOLN
0.3300 mL | Freq: Once | INTRAMUSCULAR | Status: AC
Start: 2017-01-25 — End: 2017-01-25
  Administered 2017-01-25: 0.3 mL

## 2017-01-25 NOTE — Patient Instructions (Signed)

## 2017-01-25 NOTE — Progress Notes (Signed)
David Salazar - 41 y.o. male MRN [id number]  Date of birth: [birth year]  Office Visit Note: Visit Date: 01/25/2017 PCP: Shirline Frees, NP Referred by: Shirline Frees, NP  Subjective: Chief Complaint  Patient presents with  . Lower Back - Pain   HPI: Mr. David Salazar is a 41 year old gentleman that I see in the past for epidural injection prior to a L5-S1 microdiscectomy. He reports after the surgery is doing fairly well now is having a lot of back pain and buttock pain with some referral on the leg.. Mainly in lower back and buttocks. Occasionally radiates down right leg to calf. Mainly radiating pain just happens with increased activity and working in garage. Last week he had a vibrating pain right lower leg that came and went for a few hours and hasn't happened since.States he normally takes ibuprofen for pain and occisionally tramadol. Dr. Ophelia Charter requested diagnostic and therapeutic right L5 and S1 transforaminal epidural steroid injection.    ROS Otherwise per HPI.  Assessment & Plan: Visit Diagnoses:  1. Lumbar radiculopathy     Plan: Findings:  Right L5 and S1 transforaminal epidural steroid injection.    Meds & Orders:  Meds ordered this encounter  Medications  . lidocaine (PF) (XYLOCAINE) 1 % injection 0.3 mL  . methylPREDNISolone acetate (DEPO-MEDROL) injection 80 mg    Orders Placed This Encounter  Procedures  . XR C-ARM NO REPORT  . Epidural Steroid injection    Follow-up: Return if symptoms worsen or fail to improve, 2 weeks, for scheduled follow up with Dr. Ophelia Charter.   Procedures: No procedures performed  Lumbosacral Transforaminal Epidural Steroid Injection - Infraneural Approach with Fluoroscopic Guidance  Patient: David Salazar      Date of Birth: 1976/07/04 MRN: [id number] PCP: Shirline Frees, NP      Visit Date: 01/25/2017   Universal Protocol:    Date/Time: 01/31/181:38 PM  Consent Given By: the patient  Position: PRONE   Additional  Comments: Vital signs were monitored before and after the procedure. Patient was prepped and draped in the usual sterile fashion. The correct patient, procedure, and site was verified.   Injection Procedure Details:  Procedure Site One Meds Administered:  Meds ordered this encounter  Medications  . lidocaine (PF) (XYLOCAINE) 1 % injection 0.3 mL  . methylPREDNISolone acetate (DEPO-MEDROL) injection 80 mg      Laterality: Right  Location/Site:  L5-S1 S1-2  Needle size: 22 G  Needle type: Spinal  Needle Placement: Transforaminal  Findings:  -Contrast Used: 1 mL iohexol 180 mg iodine/mL   -Comments: Excellent flow of contrast along the nerve and into the epidural space.  Procedure Details: After squaring off the end-plates of the desired vertebral level to get a true AP view, the C-arm was obliqued to the painful side so that the superior articulating process is positioned about 1/3 the length of the inferior endplate.  The needle was aimed toward the junction of the superior articular process and the transverse process of the inferior vertebrae. The needle's initial entry is in the lower third of the foramen through Kambin's triangle. The soft tissues overlying this target were infiltrated with 2-3 ml. of 1% Lidocaine without Epinephrine.  The spinal needle was then inserted and advanced toward the target using a "trajectory" view along the fluoroscope beam.  Under AP and lateral visualization, the needle was advanced so it did not puncture dura and did not traverse medially beyond the 6 o'clock position of the pedicle. Bi-planar projections were used  to confirm position. Aspiration was confirmed to be negative for CSF and/or blood. A 1-2 ml. volume of Isovue-250 was injected and flow of contrast was noted at each level. Radiographs were obtained for documentation purposes.   After attaining the desired flow of contrast documented above, a 0.5 to 1.0 ml test dose of 0.25% Marcaine  was injected into each respective transforaminal space.  The patient was observed for 90 seconds post injection.  After no sensory deficits were reported, and normal lower extremity motor function was noted,   the above injectate was administered so that equal amounts of the injectate were placed at each foramen (level) into the transforaminal epidural space.   Additional Comments:  The patient tolerated the procedure well Dressing: Band-Aid    Post-procedure details: Patient was observed during the procedure. Post-procedure instructions were reviewed.  Patient left the clinic in stable condition.    Clinical History: No specialty comments available.  He reports that he has quit smoking. His smoking use included E-cigarettes and Cigarettes. He quit after 15.00 years of use. He has never used smokeless tobacco. No results for input(s): HGBA1C, LABURIC in the last 8760 hours.  Objective:  VS:  HT:    WT:   BMI:     BP:120/82  HR:75bpm  TEMP: ( )  RESP:97 % Physical Exam  Musculoskeletal:  Patient ambulates without aid with good distal strength.    Ortho Exam Imaging: Xr C-arm No Report  Result Date: 01/25/2017 Please see Notes or Procedures tab for imaging impression.   Past Medical/Family/Surgical/Social History: Medications & Allergies reviewed per EMR Patient Active Problem List   Diagnosis Date Noted  . S/P lumbar discectomy 03/18/2016  . Hyperlipidemia 10/27/2009  . ALCOHOL ABUSE 10/27/2009  . HYPERTENSION 10/27/2009   Past Medical History:  Diagnosis Date  . Alcohol abuse    Sober for 7 years ( 2017).   . Chronic lower back pain    "hopefull not after this OR today" (03/18/2016)  . PONV (postoperative nausea and vomiting)    "just w/my wisdom teeth" (03/18/2016)   Family History  Problem Relation Age of Onset  . Alcohol abuse Father   . Hyperlipidemia Father   . Diabetes Father    Past Surgical History:  Procedure Laterality Date  . BACK SURGERY    .  LUMBAR LAMINECTOMY/DECOMPRESSION MICRODISCECTOMY N/A 03/18/2016   Procedure: Right L5-S1 Microdiscectomy;  Surgeon: Eldred MangesMark C Yates, MD;  [facility];  Service: Orthopedics;  Laterality: N/A;  . MICRODISCECTOMY LUMBAR Right 03/18/2016   L5-S1  . WISDOM TOOTH EXTRACTION  1992   Social History   Occupational History  . Not on file.   Social History Main Topics  . Smoking status: Former Smoker    Years: 15.00    Types: E-cigarettes, Cigarettes  . Smokeless tobacco: Never Used     Comment: 03/18/2016 "I use an electronic cigarette"  . Alcohol use No  . Drug use: No  . Sexual activity: Yes

## 2017-01-25 NOTE — Procedures (Signed)
Lumbosacral Transforaminal Epidural Steroid Injection - Infraneural Approach with Fluoroscopic Guidance  Patient: David Salazar      Date of Birth: [birth year] MRN: [id number] PCP: Shirline Freesory Nafziger, NP      Visit Date: 01/25/2017   Universal Protocol:    Date/Time: 01/31/181:38 PM  Consent Given By: the patient  Position: PRONE   Additional Comments: Vital signs were monitored before and after the procedure. Patient was prepped and draped in the usual sterile fashion. The correct patient, procedure, and site was verified.   Injection Procedure Details:  Procedure Site One Meds Administered:  Meds ordered this encounter  Medications  . lidocaine (PF) (XYLOCAINE) 1 % injection 0.3 mL  . methylPREDNISolone acetate (DEPO-MEDROL) injection 80 mg      Laterality: Right  Location/Site:  L5-S1 S1-2  Needle size: 22 G  Needle type: Spinal  Needle Placement: Transforaminal  Findings:  -Contrast Used: 1 mL iohexol 180 mg iodine/mL   -Comments: Excellent flow of contrast along the nerve and into the epidural space.  Procedure Details: After squaring off the end-plates of the desired vertebral level to get a true AP view, the C-arm was obliqued to the painful side so that the superior articulating process is positioned about 1/3 the length of the inferior endplate.  The needle was aimed toward the junction of the superior articular process and the transverse process of the inferior vertebrae. The needle's initial entry is in the lower third of the foramen through Kambin's triangle. The soft tissues overlying this target were infiltrated with 2-3 ml. of 1% Lidocaine without Epinephrine.  The spinal needle was then inserted and advanced toward the target using a "trajectory" view along the fluoroscope beam.  Under AP and lateral visualization, the needle was advanced so it did not puncture dura and did not traverse medially beyond the 6 o'clock position of the pedicle. Bi-planar  projections were used to confirm position. Aspiration was confirmed to be negative for CSF and/or blood. A 1-2 ml. volume of Isovue-250 was injected and flow of contrast was noted at each level. Radiographs were obtained for documentation purposes.   After attaining the desired flow of contrast documented above, a 0.5 to 1.0 ml test dose of 0.25% Marcaine was injected into each respective transforaminal space.  The patient was observed for 90 seconds post injection.  After no sensory deficits were reported, and normal lower extremity motor function was noted,   the above injectate was administered so that equal amounts of the injectate were placed at each foramen (level) into the transforaminal epidural space.   Additional Comments:  The patient tolerated the procedure well Dressing: Band-Aid    Post-procedure details: Patient was observed during the procedure. Post-procedure instructions were reviewed.  Patient left the clinic in stable condition.

## 2017-02-24 ENCOUNTER — Ambulatory Visit (INDEPENDENT_AMBULATORY_CARE_PROVIDER_SITE_OTHER): Payer: 59 | Admitting: Orthopaedic Surgery

## 2017-02-24 DIAGNOSIS — Z9889 Other specified postprocedural states: Secondary | ICD-10-CM

## 2017-02-24 NOTE — Progress Notes (Signed)
Office Visit Note   Patient: David Salazar           Date of Birth: 1976-09-23           MRN: [id number] Visit Date: 02/24/2017              Requested by: Shirline Freesory Nafziger, NP [address] [place], [place] [id number] PCP: Shirline Freesory Nafziger, NP   Assessment & Plan: Visit Diagnoses:  1. S/P lumbar discectomy     Plan: Postlaminectomy with some continued back pain. MRI scan after surgery demonstrated no evidence of recurrence. He has a normal amount of granulation tissue around the S1 nerve root. There is left-sided disc protrusions at L3-4 and L4-5 but he is asymptomatic on the left side. Scan before and after surgery showed no right-sided lesions at L3-4 and L4-5. He'll continue a walking program. Continue stretching. Ibuprofen continued and prescription for tramadol given. He'll gradually wean himself off of this and I will recheck him again in 5 months.  Follow-Up Instructions: Return in about 5 months (around 07/27/2017).   Orders:  No orders of the defined types were placed in this encounter.  No orders of the defined types were placed in this encounter.     Procedures: No procedures performed   Clinical Data: No additional findings.   Subjective: Chief Complaint  Patient presents with  . Lower Back - Pain, Follow-up    Patient is here today to follow up on his lower back.  He had a lumbar ESI with Dr. Alvester MorinNewton on 01/25/17 with relief for only 1 to 2 days. He states his lower back pain is constant across lower portion of back, pain increases with long periods of standing. Patient states he occasionally has radiating pain down his right leg. He takes Ibuprofen for pain and tramadol as needed.   Patient has days when he has minimal pain. Other times he has aching in his back but states his leg pain is significantly better from before surgery. He has discomfort with prolonged standing occasionally radiates in his right leg. He's used ibuprofen intermittently and rarely  takes tramadol. No bowel or bladder symptoms no fever or chills. He has some aching with prolonged car rides. Stiffness when he first gets up after sitting.  Review of Systems 14 point review of systems is updated and is unchanged from last office visit other than as mentioned above.   Objective: Vital Signs: There were no vitals taken for this visit.  Physical Exam  Constitutional: He is oriented to person, place, and time. He appears well-developed and well-nourished.  HENT:  Head: Normocephalic and atraumatic.  Eyes: EOM are normal. Pupils are equal, round, and reactive to light.  Neck: No tracheal deviation present. No thyromegaly present.  Cardiovascular: Normal rate and regular rhythm.   Pulmonary/Chest: Effort normal. He has no wheezes.  Abdominal: Soft. Bowel sounds are normal. He exhibits no distension. There is no tenderness.  Musculoskeletal:  Negative straight leg raising right and left at 90. Minimal sciatic notch tenderness. Lumbar incision is well-healed. Negative popliteal compression test. He is able to heel and toe walk. EHL peroneal's are strong.  Neurological: He is alert and oriented to person, place, and time.  Skin: Skin is warm and dry. Capillary refill takes less than 2 seconds.  Psychiatric: He has a normal mood and affect. His behavior is normal. Judgment and thought content normal.   blood pressure 125/82 pulse 75.  Ortho Exam and ankle jerk are intact. Gait extension  lateral bending.  Specialty Comments:  No specialty comments available.  Imaging: Show images for MR Lumbar Spine W Wo Contrast  Study Result   CLINICAL DATA:  Right-sided low back pain and right leg pain. Weakness and numbness in the right leg. Lumbar spine surgery 02/2016.  EXAM: MRI LUMBAR SPINE WITHOUT AND WITH CONTRAST  TECHNIQUE: Multiplanar and multiecho pulse sequences of the lumbar spine were obtained without and with intravenous contrast.  CONTRAST:  19 mL  MultiHance  COMPARISON:  02/20/2016  FINDINGS: Segmentation:  Standard.  Alignment:  Normal.  Vertebrae: Preserved vertebral body heights without evidence of fracture. Mild-to-moderate type 1 endplate changes at L5-S1, increased on the right compared to the prior study though decreased the closer to the midline. No suspicious osseous lesion identified.  Conus medullaris: Extends to the T12-L1 level and appears normal.  Paraspinal and other soft tissues: Unremarkable aside from postoperative changes in the posterior soft tissues at L5-S1. No fluid collection.  Disc levels:  L1-2 and L2-3:  Negative.  L3-4: Unchanged disc desiccation and slight disc space height loss. Small left foraminal to extraforaminal disc protrusion is unchanged and does not result in significant stenosis or clear impingement of the exiting left L3 nerve. Mild facet and ligamentum flavum hypertrophy are unchanged.  L4-5: Left foraminal and extraforaminal disc protrusion with annular fissure may irritate the left L4 nerve lateral to the foramen but does not result in significant stenosis, unchanged. Mild facet and ligamentum flavum hypertrophy are unchanged.  L5-S1: Interval right laminectomy. Epidural enhancement is present at the laminectomy site and surrounding the right S1 nerve roots in the lateral recess, compatible with postoperative granulation tissue. No recurrent disc herniation, spinal stenosis, or lateral recess stenosis is seen. Moderate disc space narrowing and mild disc bulging result in minimal right neural foraminal narrowing, unchanged.  IMPRESSION: 1. Interval postoperative changes at L5-S1 without evidence of recurrent disc herniation. Epidural granulation tissue surrounding the right S1 nerve in the lateral recess. 2. Unchanged left-sided disc protrusions at L3-4 and L4-5.   Electronically Signed   By: Sebastian Ache M.D.   On: 10/01/2016 19:25       PMFS  History: Patient Active Problem List   Diagnosis Date Noted  . S/P lumbar discectomy 03/18/2016  . Hyperlipidemia 10/27/2009  . ALCOHOL ABUSE 10/27/2009  . HYPERTENSION 10/27/2009   Past Medical History:  Diagnosis Date  . Alcohol abuse    Sober for 7 years ( 2017).   . Chronic lower back pain    "hopefull not after this OR today" (03/18/2016)  . PONV (postoperative nausea and vomiting)    "just w/my wisdom teeth" (03/18/2016)    Family History  Problem Relation Age of Onset  . Alcohol abuse Father   . Hyperlipidemia Father   . Diabetes Father     Past Surgical History:  Procedure Laterality Date  . BACK SURGERY    . LUMBAR LAMINECTOMY/DECOMPRESSION MICRODISCECTOMY N/A 03/18/2016   Procedure: Right L5-S1 Microdiscectomy;  Surgeon: Eldred Manges, MD;  Location: [facility] OR;  Service: Orthopedics;  Laterality: N/A;  . MICRODISCECTOMY LUMBAR Right 03/18/2016   L5-S1  . WISDOM TOOTH EXTRACTION  1992   Social History   Occupational History  . Not on file.   Social History Main Topics  . Smoking status: Former Smoker    Years: 15.00    Types: E-cigarettes, Cigarettes  . Smokeless tobacco: Never Used     Comment: 03/18/2016 "I use an electronic cigarette"  . Alcohol use No  .  Drug use: No  . Sexual activity: Yes

## 2017-03-28 ENCOUNTER — Ambulatory Visit (INDEPENDENT_AMBULATORY_CARE_PROVIDER_SITE_OTHER): Payer: 59 | Admitting: Adult Health

## 2017-03-28 ENCOUNTER — Encounter: Payer: Self-pay | Admitting: Adult Health

## 2017-03-28 VITALS — BP 126/68 | Temp 97.9°F | Ht 76.0 in | Wt 215.2 lb

## 2017-03-28 DIAGNOSIS — Z9889 Other specified postprocedural states: Secondary | ICD-10-CM

## 2017-03-28 NOTE — Progress Notes (Signed)
Subjective:    Patient ID: David Salazar, male    DOB: May 03, 1976, 41 y.o.   MRN: [id number]  HPI   41 year old male who presents to the office today for continued lumbar back pain. He has been following the advised of his orthopedic surgeon Dr. Ophelia Charter. He reports no improvement over the last year. Per patient " it feels like everything is grinding down there." he continues " It is depressing when all the neighbors are outside working in the yard and I am inside because of the pain."   He is wondering what other options he has. He does not want to go on opiates.   Review of Systems See HPI   Past Medical History:  Diagnosis Date  . Alcohol abuse    Sober for 7 years ( 2017).   . Chronic lower back pain    "hopefull not after this OR today" (03/18/2016)  . PONV (postoperative nausea and vomiting)    "just w/my wisdom teeth" (03/18/2016)    Social History   Social History  . Marital status: Married    Spouse name: N/A  . Number of children: N/A  . Years of education: N/A   Occupational History  . Not on file.   Social History Main Topics  . Smoking status: Former Smoker    Years: 15.00    Types: E-cigarettes, Cigarettes  . Smokeless tobacco: Never Used     Comment: 03/18/2016 "I use an electronic cigarette"  . Alcohol use No  . Drug use: No  . Sexual activity: Yes   Other Topics Concern  . Not on file   Social History Narrative  . No narrative on file    Past Surgical History:  Procedure Laterality Date  . BACK SURGERY    . LUMBAR LAMINECTOMY/DECOMPRESSION MICRODISCECTOMY N/A 03/18/2016   Procedure: Right L5-S1 Microdiscectomy;  Surgeon: Eldred Manges, MD;  [facility];  Service: Orthopedics;  Laterality: N/A;  . MICRODISCECTOMY LUMBAR Right 03/18/2016   L5-S1  . WISDOM TOOTH EXTRACTION  1992    Family History  Problem Relation Age of Onset  . Alcohol abuse Father   . Hyperlipidemia Father   . Diabetes Father     No Known Allergies  Current  Outpatient Prescriptions on File Prior to Visit  Medication Sig Dispense Refill  . ibuprofen (ADVIL,MOTRIN) 200 MG tablet Take 200 mg by mouth every 6 (six) hours as needed.    . triamcinolone cream (KENALOG) 0.1 % Apply 1 application topically 2 (two) times daily. 30 g 3   No current facility-administered medications on file prior to visit.     BP 126/68 (BP Location: Left Arm, Patient Position: Sitting, Cuff Size: Normal)   Temp 97.9 F (36.6 C) (Oral)   Ht  (1.93 m)   Wt 215 lb 3.2 oz (97.6 kg)   BMI 26.19 kg/m       Objective:   Physical Exam  Constitutional: He is oriented to person, place, and time. He appears well-developed and well-nourished. No distress.  Cardiovascular: Normal rate, regular rhythm, normal heart sounds and intact distal pulses.  Exam reveals no gallop and no friction rub.   No murmur heard. Pulmonary/Chest: Effort normal and breath sounds normal. No respiratory distress. He has no wheezes. He has no rales. He exhibits no tenderness.  Musculoskeletal: Normal range of motion. He exhibits no edema, tenderness or deformity.  Neurological: He is alert and oriented to person, place, and time.  Skin: Skin is  warm and dry. No rash noted. He is not diaphoretic. No erythema. No pallor.  Psychiatric: He has a normal mood and affect. His behavior is normal. Judgment and thought content normal.  Nursing note and vitals reviewed.     Assessment & Plan:  1. S/P lumbar discectomy - Continued pain. We talked about options such as physical therapy and pain management clinic. He is open to both of these.  - I also advised to try acupuncture.  - Ambulatory referral to Physical Therapy - Ambulatory referral to Pain Clinic  Shirline Frees, NP

## 2017-03-29 ENCOUNTER — Telehealth (INDEPENDENT_AMBULATORY_CARE_PROVIDER_SITE_OTHER): Payer: Self-pay | Admitting: Orthopaedic Surgery

## 2017-03-29 NOTE — Telephone Encounter (Signed)
Pt requested a call back, stated he had a couple of questions for you about his back.  [phone]

## 2017-03-30 NOTE — Telephone Encounter (Signed)
I called patient. Left voicemail for return call.  

## 2017-03-31 MED ORDER — TRAMADOL HCL 50 MG PO TABS: 50.0000 mg | ORAL_TABLET | Freq: Two times a day (BID) | ORAL | 0 refills | Status: DC | PRN

## 2017-03-31 NOTE — Telephone Encounter (Signed)
I called patient and advised. I also called in RX for tramadol. Patient aware.

## 2017-03-31 NOTE — Telephone Encounter (Signed)
Ok to call in # 15 ultram. OK for PT/ massage/heating pad , aspircreme, etc.  thanks

## 2017-03-31 NOTE — Telephone Encounter (Signed)
Patient has had increasing back pain since last weekend. He is having increased muscle spasms in his lower back and right buttocks.   He would like to know if there is something that could be called in for him. He has tried methocarbamol in the past with some relief. He has also tried increased amounts of advil with no relief. He did not know if he could get something stronger for the pain. He has had tried tramadol prior with relief. He really would not like anything stronger than that.   He has spoken with his medical doctor because when he tried to make an appt, it was going to be a few weeks out. He has ordered physical therapy and David Salazar wants to be sure that you agree with this plan.   Last question, is there a place that you would recommend for massage and if so, do you think that is appropriate for him?  Please advise.

## 2017-03-31 NOTE — Addendum Note (Signed)
Addended by: Rogers Seeds on: 03/31/2017 05:04 PM   Modules accepted: Orders

## 2017-04-06 ENCOUNTER — Ambulatory Visit: Payer: 59 | Attending: Adult Health | Admitting: Physical Therapy

## 2017-04-06 DIAGNOSIS — M5441 Lumbago with sciatica, right side: Secondary | ICD-10-CM | POA: Insufficient documentation

## 2017-04-06 DIAGNOSIS — M6281 Muscle weakness (generalized): Secondary | ICD-10-CM | POA: Diagnosis present

## 2017-04-06 DIAGNOSIS — G8929 Other chronic pain: Secondary | ICD-10-CM | POA: Insufficient documentation

## 2017-04-06 DIAGNOSIS — M25651 Stiffness of right hip, not elsewhere classified: Secondary | ICD-10-CM

## 2017-04-06 NOTE — Patient Instructions (Signed)
      Also in afternoons or evening double knee to chest lying on your back 7-8x    Lavinia Sharps PT [facility] [address], [address] [place], [place] [address] Phone # [phone] Fax [phone]

## 2017-04-06 NOTE — Therapy (Signed)
[facility] [address], [address] [place], [place], [address] Phone: [phone]   Fax:  [phone]  Physical Therapy Evaluation  Patient Details  Name: David Salazar MRN: [id number] Date of Birth: 11-27-1976 Referring Provider: Shirline Frees  Encounter Date: 04/06/2017      PT End of Session - 04/06/17 0852    Visit Number 1   Number of Visits 20   Date for PT Re-Evaluation 06/01/17   Authorization Type U[employer] 20 visit limit   PT Start Time 0803   PT Stop Time 0850   PT Time Calculation (min) 47 min   Activity Tolerance Patient tolerated treatment well      Past Medical History:  Diagnosis Date  . Alcohol abuse    Sober for 7 years ( 2017).   . Chronic lower back pain    "hopefull not after this OR today" (03/18/2016)  . PONV (postoperative nausea and vomiting)    "just w/my wisdom teeth" (03/18/2016)    Past Surgical History:  Procedure Laterality Date  . BACK SURGERY    . LUMBAR LAMINECTOMY/DECOMPRESSION MICRODISCECTOMY N/A 03/18/2016   Procedure: Right L5-S1 Microdiscectomy;  Surgeon: Eldred Manges, MD;  Location: [facility] OR;  Service: Orthopedics;  Laterality: N/A;  . MICRODISCECTOMY LUMBAR Right 03/18/2016   L5-S1  . WISDOM TOOTH EXTRACTION  1992    There were no vitals filed for this visit.       Subjective Assessment - 04/06/17 0808    Subjective Had HNP L5-S1 2 years ago and did not improve with conservative treatments PT, 2 ESI.  Had right LE musclar spasms intensely.  Had lami/microdiscectomy 03/18/16.  Improved then symptoms returned.  Had 2 more ESI with no improvement.  MRI shows no herniation, narrow disc space though.  Have discussed additonal surgery but has 2 more HNP above that level.  Continues to have right LE lateral calf tingling intermittently.  Tightness posterior HS and buttocks.  Primary complaint is LBP radiates right > left.     Limitations House hold activities;Sitting   Currently in  Pain? Yes   Pain Score 3    Pain Location Back   Pain Orientation Right;Left;Lower   Pain Type Chronic pain   Pain Onset More than a month ago   Pain Frequency Constant   Aggravating Factors  randomly; sitting at work;  end of the week   Pain Relieving Factors walking; supine with legs elevated; heat            [facility] PT Assessment - 04/06/17 0001      Assessment   Medical Diagnosis LBP with right LE symptoms   Referring Provider Shirline Frees   Onset Date/Surgical Date 03/18/16   Hand Dominance Right   Next MD Visit Ortho 4/27   Prior Therapy prior to surgery     Precautions   Precautions None     Restrictions   Weight Bearing Restrictions No     Balance Screen   Has the patient fallen in the past 6 months No   Has the patient had a decrease in activity level because of a fear of falling?  No   Is the patient reluctant to leave their home because of a fear of falling?  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   Leisure do things outside, yardwork     Observation/Other Assessments   Focus on Therapeutic Outcomes (FOTO)  61% limitation     AROM   AROM Assessment Site --  side bending is most painful; extension and flex also limit   Right/Left Hip Right;Left   Right Hip External Rotation  20   Right Hip Internal Rotation  10   Left Hip External Rotation  30   Left Hip Internal Rotation  15   Lumbar Flexion 25   Lumbar Extension 10   Lumbar - Right Side Bend 34   Lumbar - Left Side Bend 34     Strength   Right Hip Flexion 4-/5   Right Hip Extension 4-/5   Right Hip ABduction 4-/5   Lumbar Flexion 3+/5   Lumbar Extension 3+/5     Palpation   Palpation comment tender bil lumbar paraspinals;  tender points in  right gluteals     Slump test   Findings Positive   Comment bil     Prone Knee Bend Test   Findings Positive   Comment bil     Straight Leg Raise   Findings Negative   Comment bil                           PT Education - 04/06/17 0848    Education provided Yes   Education Details prone press ups, supine double knee to chest   Person(s) Educated Patient   Methods Explanation;Demonstration;Handout   Comprehension Verbalized understanding;Returned demonstration          PT Short Term Goals - 04/06/17 0913      PT SHORT TERM GOAL #1   Title The patient will be able initiate a home ex program to improve soft tissue and joint mobility with minimal increase in discomfort  05/04/17   Time 4   Period Weeks   Status New     PT SHORT TERM GOAL #2   Title The patient will have improved lumbar flexion to 40 degrees and extension to 15 degrees needed for improved mobility and future return to yardwork   Time 4   Period Weeks   Status New     PT SHORT TERM GOAL #3   Title The patient will have improved pain with usual ADLs at the end of the day and end of the week by 25%    Time 4   Period Weeks   Status New     PT SHORT TERM GOAL #4   Title The patient will have improved right hip external rotation to 25 degrees and internal rotation to 15 degrees needed for return to yard work   Time 4   Period Weeks   Status New           PT Long Term Goals - 04/06/17 0919      PT LONG TERM GOAL #1   Title The patient will be independent in safe self progression of HEP for further improvements in ROM, pain and strength  06/01/17   Time 8   Period Weeks   Status New     PT LONG TERM GOAL #2   Title The patient will report a 50% reduction in pain at the end of the day and end of the week   Time 8   Period Weeks  Status New     PT LONG TERM GOAL #3   Title The patient will have improved abdominal and lumbar extensor strength to 4/5 needed for yard work    Time 8   Period Weeks   Status New     PT LONG TERM GOAL #4   Title The patient will have improved hip strength to 4/5 needed for stooping, kneeling needed for yard work   Time 8   Period Weeks   Status New     PT LONG TERM GOAL #5   Title The patient will have improved FOTO from 61% limitation to 44% indicating improved function with less pain   Time 8   Period Weeks   Status New               Plan - 04/06/17 0901    Clinical Impression Statement Had HNP L5-S1 2 years ago and did not improve with conservative treatments PT, 2 ESI.  Had right LE musclar spasms intensely as well as motor loss in right foot.  Had lami/microdiscectomy 03/18/16.  Improved initially but then symptoms returned.  Had 2 more ESI with no improvement.  MRI shows no herniation, narrow disc space though.  Have discussed additonal surgery (fusion) but has 2 more HNP above that level and states there is the potential his problem could be worsened.  Continues to have right LE lateral calf tingling intermittently.  Tightness posterior HS and buttocks.  Primary complaint is LBP radiates right > left on a constant basis.  He is unable to do yard work and is worsened with prolonged sitting, at the end of the week and at random times.  He avoids flexion.  His lumbar ROM is significantly limited in all directions.  Decreased bilateral hip external and internal ROM.  Decreased right LE strength grossly 4-/5.  Decreased core strength.  +SLR, Slump test and prone knee bend bilaterally.  Possible adherent nerve root.  The patient is of low complexity evaluation secondary to good psychosocial support and lack of co-morbidities.     Rehab Potential Good   PT Frequency 2x / week   PT Duration 8 weeks   PT Treatment/Interventions ADLs/Self Care Home Management;Cryotherapy;Electrical Stimulation;Moist Heat;Ultrasound;Traction;Patient/family education;Neuromuscular re-education;Therapeutic exercise;Therapeutic activities;Manual  techniques;Taping;Dry needling   PT Next Visit Plan assess response to prone press ups, double knee to chest;  start neural gliding; abdominal brace; try e-stim/heat for pain control and soft tissue mobility;  patient may consider DN at a later time      Patient will benefit from skilled therapeutic intervention in order to improve the following deficits and impairments:  Decreased activity tolerance, Decreased range of motion, Decreased strength, Hypomobility, Increased fascial restricitons, Pain  Visit Diagnosis: Chronic bilateral low back pain with right-sided sciatica - Plan: PT plan of care cert/re-cert  Muscle weakness (generalized) - Plan: PT plan of care cert/re-cert  Stiffness of right hip, not elsewhere classified - Plan: PT plan of care cert/re-cert     Problem List Patient Active Problem List   Diagnosis Date Noted  . S/P lumbar discectomy 03/18/2016  . Hyperlipidemia 10/27/2009  . ALCOHOL ABUSE 10/27/2009  . HYPERTENSION 10/27/2009   David Salazar, PT 04/06/17 9:29 AM Phone: [phone] Fax: [phone]  David Salazar 04/06/2017, 9:28 AM  [facility] [address] [place], [place], [address] Phone: [phone]   Fax:  [phone]  Name: David Salazar MRN: [id number] Date of Birth: [birth year]

## 2017-04-10 ENCOUNTER — Encounter: Payer: Self-pay | Admitting: Physical Therapy

## 2017-04-10 ENCOUNTER — Ambulatory Visit: Payer: 59 | Admitting: Physical Therapy

## 2017-04-10 DIAGNOSIS — M5441 Lumbago with sciatica, right side: Secondary | ICD-10-CM | POA: Diagnosis not present

## 2017-04-10 DIAGNOSIS — M25651 Stiffness of right hip, not elsewhere classified: Secondary | ICD-10-CM

## 2017-04-10 DIAGNOSIS — G8929 Other chronic pain: Secondary | ICD-10-CM

## 2017-04-10 DIAGNOSIS — M6281 Muscle weakness (generalized): Secondary | ICD-10-CM

## 2017-04-10 NOTE — Therapy (Signed)
[facility] [address], [place], [place], [address] Phone: [phone]   Fax:  [phone]  Physical Therapy Treatment  Patient Details  Name: David Salazar MRN: [id number] Date of Birth: Feb 25, 1976 Referring Provider: Dorothyann Peng  Encounter Date: 04/10/2017      PT End of Session - 04/10/17 0839    Visit Number 2   Number of Visits 20   Date for PT Re-Evaluation 06/01/17   Authorization Type UHC 20 visit limit   Authorization - Visit Number 2   Authorization - Number of Visits 20   PT Start Time 0800   PT Stop Time 0900   PT Time Calculation (min) 60 min   Activity Tolerance Patient tolerated treatment well   Behavior During Therapy [facility] for tasks assessed/performed      Past Medical History:  Diagnosis Date  . Alcohol abuse    Sober for 7 years ( 2017).   . Chronic lower back pain    "hopefull not after this OR today" (03/18/2016)  . PONV (postoperative nausea and vomiting)    "just w/my wisdom teeth" (03/18/2016)    Past Surgical History:  Procedure Laterality Date  . BACK SURGERY    . LUMBAR LAMINECTOMY/DECOMPRESSION MICRODISCECTOMY N/A 03/18/2016   Procedure: Right L5-S1 Microdiscectomy;  Surgeon: Marybelle Killings, MD;  Location: [facility];  Service: Orthopedics;  Laterality: N/A;  . MICRODISCECTOMY LUMBAR Right 03/18/2016   L5-S1  . WISDOM TOOTH EXTRACTION  1992    There were no vitals filed for this visit.      Subjective Assessment - 04/10/17 0800    Subjective I feel okay today. No changes in my symptoms.   Limitations House hold activities;Sitting   Currently in Pain? Yes   Pain Score 3    Pain Location Back   Pain Orientation Right;Left;Lower   Pain Descriptors / Indicators Aching;Dull   Pain Type Chronic pain   Pain Onset More than a month ago   Pain Frequency Constant   Aggravating Factors  randomly; sitting at work; end of the week   Pain Relieving Factors walking; supine with  legs elevated; heat   Multiple Pain Sites No                         [facility] Adult PT Treatment/Exercise - 04/10/17 0001      Lumbar Exercises: Stretches   Active Hamstring Stretch 2 reps;30 seconds   Piriformis Stretch 2 reps;30 seconds     Modalities   Modalities Electrical Stimulation;Moist Heat     Moist Heat Therapy   Number Minutes Moist Heat 15 Minutes   Moist Heat Location Lumbar Spine     Electrical Stimulation   Electrical Stimulation Location lumbar in prone   Electrical Stimulation Action IFC   Electrical Stimulation Parameters 15 min to patient tolerance   Electrical Stimulation Goals Pain     Manual Therapy   Manual Therapy Soft tissue mobilization;Neural Stretch   Soft tissue mobilization bilateral lumbar paraspinals and quadratus   Neural Stretch right lower extremity in supine                PT Education - 04/10/17 0838    Education provided Yes   Education Details body mechanics, posture, stretches,    Person(s) Educated Patient   Methods Explanation;Demonstration;Verbal cues;Handout   Comprehension Returned demonstration;Verbalized understanding          PT Short Term Goals - 04/10/17 3790  PT SHORT TERM GOAL #1   Title The patient will be able initiate a home ex program to improve soft tissue and joint mobility with minimal increase in discomfort  05/04/17   Time 4   Period Weeks   Status New     PT SHORT TERM GOAL #2   Title The patient will have improved lumbar flexion to 40 degrees and extension to 15 degrees needed for improved mobility and future return to yardwork   Time 4   Period Weeks   Status New     PT SHORT TERM GOAL #3   Title The patient will have improved pain with usual ADLs at the end of the day and end of the week by 25%    Time 4   Period Weeks   Status New     PT SHORT TERM GOAL #4   Title The patient will have improved right hip external rotation to 25 degrees and internal rotation to 15  degrees needed for return to yard work   Time 4   Period Weeks   Status New           PT Long Term Goals - 04/06/17 0919      PT LONG TERM GOAL #1   Title The patient will be independent in safe self progression of HEP for further improvements in ROM, pain and strength  06/01/17   Time 8   Period Weeks   Status New     PT LONG TERM GOAL #2   Title The patient will report a 50% reduction in pain at the end of the day and end of the week   Time 8   Period Weeks   Status New     PT LONG TERM GOAL #3   Title The patient will have improved abdominal and lumbar extensor strength to 4/5 needed for yard work   Time 8   Period Weeks   Status New     PT LONG TERM GOAL #4   Title The patient will have improved hip strength to 4/5 needed for stooping, kneeling needed for yard work   Time 8   Period Weeks   Status New     PT LONG TERM GOAL #5   Title The patient will have improved FOTO from 61% limitation to 44% indicating improved function with less pain   Time 8   Period Weeks   Status New               Plan - 04/10/17 6659    Clinical Impression Statement Patient has just started therapy and has not met goals yet.  Patient has learned body mechanincs with sleeping, lifting, getting in and out of car.  Patient has tightness in lumbar paraspinals.  Patient had no increase in pain after treatment.  Patient will benefit from skilled therapy to improve mobility and function.    Rehab Potential Good   Clinical Impairments Affecting Rehab Potential None   PT Frequency 2x / week   PT Duration 8 weeks   PT Treatment/Interventions ADLs/Self Care Home Management;Cryotherapy;Electrical Stimulation;Moist Heat;Ultrasound;Traction;Patient/family education;Neuromuscular re-education;Therapeutic exercise;Therapeutic activities;Manual techniques;Taping;Dry needling   PT Next Visit Plan Dry needling to lumbar and thoracic lumbar junction; core stabilization with abdominal bracing, see if  stim helped   PT Home Exercise Plan Progress as needed   Recommended Other Services None   Consulted and Agree with Plan of Care Patient      Patient will benefit from skilled therapeutic intervention in  order to improve the following deficits and impairments:  Decreased activity tolerance, Decreased range of motion, Decreased strength, Hypomobility, Increased fascial restricitons, Pain  Visit Diagnosis: Chronic bilateral low back pain with right-sided sciatica  Muscle weakness (generalized)  Stiffness of right hip, not elsewhere classified     Problem List Patient Active Problem List   Diagnosis Date Noted  . S/P lumbar discectomy 03/18/2016  . Hyperlipidemia 10/27/2009  . ALCOHOL ABUSE 10/27/2009  . HYPERTENSION 10/27/2009    Earlie Counts, PT 04/10/17 8:44 AM   [facility] [address] [place], [place], [address] Phone: [phone]   Fax:  [phone]  Name: David Salazar MRN: [id number] Date of Birth: [birth year]

## 2017-04-10 NOTE — Patient Instructions (Addendum)
Piriformis Stretch, Sitting    Sit, one ankle on opposite knee, same-side hand on crossed knee. Push down on knee, keeping spine straight. Lean torso forward, with flat back, until tension is felt in hamstrings and gluteals of crossed-leg side. Hold _30__ seconds.  Repeat _2__ times per session. Do _1__ sessions per day.  Copyright  VHI. All rights reserved.  Chair Sitting    Sit at edge of seat, spine straight, one leg extended. Put a hand on each thigh and bend forward from the hip, keeping spine straight. Allow hand on extended leg to reach toward toes. Support upper body with other arm. Hold _30__ seconds. Repeat _2__ times per session. Do _1__ sessions per day.  Copyright  VHI. All rights reserved.  Adductors, Sitting With Hip Flexion    Sit with legs open in a wide V, toes pointing up, hands on knees. Keep spine straight supporting trunk with arms. Slide arms down leg as trunk tips forward. Press knees apart. Hold _30__ seconds. Repeat _2__ times per session. Do __1_ sessions per day.  Copyright  VHI. All rights reserved.  Supine Knee-to-Chest, Unilateral    Lie on back, hands clasped behind one knee. Pull knee in toward chest until a comfortable stretch is felt in lower back and buttocks. Hold _30__ seconds.  Repeat _2__ times per session. Do _1__ sessions per day.  Copyright  VHI. All rights reserved.  Posture - Sitting    Sit upright, head facing forward. Try using a roll to support lower back. Keep shoulders relaxed, and avoid rounded back. Keep hips level with knees. Avoid crossing legs for long periods.   Copyright  VHI. All rights reserved.  Sleeping on Side    Place pillow between knees and feet. Use cervical support under neck and a roll around waist as needed.   Copyright  VHI. All rights reserved.    Show-Off    Pretend an object (jewel, tie, shelf with hot cup of coffee) is located at the top of your breastbone. Show off this jewel. Keep  "tie high". Balance the cup of coffee. Keep chest up. Practice frequently during the day. Do in sitting and standing every hour.   Copyright  VHI. All rights reserved.  Lifting Principles  .Maintain proper posture and head alignment. .Slide object as close as possible before lifting. .Move obstacles out of the way. .Test before lifting; ask for help if too heavy. .Tighten stomach muscles without holding breath. .Use smooth movements; do not jerk. .Use legs to do the work, and pivot with feet. .Distribute the work load symmetrically and close to the center of trunk. .Push instead of pull whenever possible.  Copyright  VHI. All rights reserved.  [facility] [address], [address] [place], [place] [address] Phone # [phone] Fax [phone]

## 2017-04-11 ENCOUNTER — Encounter (INDEPENDENT_AMBULATORY_CARE_PROVIDER_SITE_OTHER): Payer: Self-pay | Admitting: Orthopaedic Surgery

## 2017-04-11 ENCOUNTER — Ambulatory Visit (INDEPENDENT_AMBULATORY_CARE_PROVIDER_SITE_OTHER): Payer: 59 | Admitting: Orthopaedic Surgery

## 2017-04-11 ENCOUNTER — Telehealth (INDEPENDENT_AMBULATORY_CARE_PROVIDER_SITE_OTHER): Payer: Self-pay | Admitting: Orthopaedic Surgery

## 2017-04-11 ENCOUNTER — Ambulatory Visit (INDEPENDENT_AMBULATORY_CARE_PROVIDER_SITE_OTHER): Payer: 59

## 2017-04-11 VITALS — BP 121/81 | HR 77 | Ht 76.0 in | Wt 215.0 lb

## 2017-04-11 DIAGNOSIS — G8929 Other chronic pain: Secondary | ICD-10-CM | POA: Diagnosis not present

## 2017-04-11 DIAGNOSIS — Z9889 Other specified postprocedural states: Secondary | ICD-10-CM

## 2017-04-11 DIAGNOSIS — M5441 Lumbago with sciatica, right side: Secondary | ICD-10-CM | POA: Diagnosis not present

## 2017-04-11 MED ORDER — TRAMADOL HCL 50 MG PO TABS: 50.0000 mg | ORAL_TABLET | Freq: Two times a day (BID) | ORAL | 0 refills | Status: DC | PRN

## 2017-04-11 NOTE — Telephone Encounter (Signed)
Earlier appt made 

## 2017-04-11 NOTE — Telephone Encounter (Signed)
Patient called advised his back is really not doing well and asked if he can get an earlier appointment if possible. The number to contact patient is [phone]

## 2017-04-12 ENCOUNTER — Encounter: Payer: 59 | Admitting: Physical Therapy

## 2017-04-17 ENCOUNTER — Ambulatory Visit: Payer: 59 | Admitting: Physical Therapy

## 2017-04-17 ENCOUNTER — Encounter: Payer: Self-pay | Admitting: Physical Therapy

## 2017-04-17 DIAGNOSIS — G8929 Other chronic pain: Secondary | ICD-10-CM

## 2017-04-17 DIAGNOSIS — M25651 Stiffness of right hip, not elsewhere classified: Secondary | ICD-10-CM

## 2017-04-17 DIAGNOSIS — M6281 Muscle weakness (generalized): Secondary | ICD-10-CM

## 2017-04-17 DIAGNOSIS — M5441 Lumbago with sciatica, right side: Principal | ICD-10-CM

## 2017-04-17 NOTE — Therapy (Signed)
[facility] [address], [address] [place], [place], [address] Phone: [phone]   Fax:  [phone]  Physical Therapy Treatment  Patient Details  Name: David Salazar MRN: [id number] Date of Birth: Aug 20, 1976 Referring Provider: Shirline Frees  Encounter Date: 04/17/2017      PT End of Session - 04/17/17 0828    Visit Number 3   Number of Visits 20   Date for PT Re-Evaluation 06/01/17   Authorization Type UHC 20 visit limit   Authorization - Visit Number 3   Authorization - Number of Visits 20   PT Start Time 0800   PT Stop Time 0900   PT Time Calculation (min) 60 min   Activity Tolerance Patient tolerated treatment well   Behavior During Therapy [facility] for tasks assessed/performed      Past Medical History:  Diagnosis Date  . Alcohol abuse    Sober for 7 years ( 2017).   . Chronic lower back pain    "hopefull not after this OR today" (03/18/2016)  . PONV (postoperative nausea and vomiting)    "just w/my wisdom teeth" (03/18/2016)    Past Surgical History:  Procedure Laterality Date  . BACK SURGERY    . LUMBAR LAMINECTOMY/DECOMPRESSION MICRODISCECTOMY N/A 03/18/2016   Procedure: Right L5-S1 Microdiscectomy;  Surgeon: Eldred Manges, MD;  [facility]: [facility] OR;  Service: Orthopedics;  Laterality: N/A;  . MICRODISCECTOMY LUMBAR Right 03/18/2016   L5-S1  . WISDOM TOOTH EXTRACTION  1992    There were no vitals filed for this visit.      Subjective Assessment - 04/17/17 0805    Subjective I saw the doctor due to pain flaring up in my back and how to manage the acute pain.   I started on Cymbalta. It is slightly helping. Pain is not too bad.    Limitations House hold activities;Sitting   Diagnostic tests last MRI L5-S1 shows degeneration   Patient Stated Goals reduce pain   Currently in Pain? Yes   Pain Score 1    Pain Location Back  right hamstring   Pain Orientation Right;Lower   Pain Descriptors / Indicators  Aching;Dull   Pain Type Chronic pain   Pain Onset More than a month ago   Pain Frequency Constant   Aggravating Factors  randomly; sitting at work; end of the week   Pain Relieving Factors walking; supine with legs elevated; heat   Multiple Pain Sites No            [facility] PT Assessment - 04/17/17 0001      Posture/Postural Control   Posture/Postural Control Postural limitations   Postural Limitations Forward head;Decreased lumbar lordosis     AROM   Right Hip External Rotation  50   Right Hip Internal Rotation  30   Left Hip External Rotation  50   Left Hip Internal Rotation  30                     [facility] Adult PT Treatment/Exercise - 04/17/17 0001      Lumbar Exercises: Stretches   Active Hamstring Stretch 2 reps;30 seconds   Piriformis Stretch 2 reps;30 seconds     Lumbar Exercises: Supine   Clam 10 reps  with abdominal bracing, bil.    Bent Knee Raise 10 reps  abdominal bracing; bil.    Bridge 15 reps  VC to go slowly     Lumbar Exercises: Prone   Single Arm Raise 20 reps  bil. with VC on breath and brace abdominals   Straight Leg Raise 20 reps  VC on breath; abdominal bracing     Modalities   Modalities Electrical Stimulation;Moist Heat     Moist Heat Therapy   Number Minutes Moist Heat 15 Minutes   Moist Heat Location Lumbar Spine     Electrical Stimulation   Electrical Stimulation Location lumbar in prone   Electrical Stimulation Action IFC   Electrical Stimulation Parameters 15 min, to patient tolerance   Electrical Stimulation Goals Pain     Manual Therapy   Manual Therapy Neural Stretch;Soft tissue mobilization   Soft tissue mobilization rolling the right hamstring out to release the muscle   Neural Stretch right lower extremity in supine                PT Education - 04/17/17 0841    Education provided No          PT Short Term Goals - 04/17/17 0821      PT SHORT TERM GOAL #1   Title The patient will be able  initiate a home ex program to improve soft tissue and joint mobility with minimal increase in discomfort  05/04/17   Time 4   Period Weeks   Status Achieved     PT SHORT TERM GOAL #2   Title The patient will have improved lumbar flexion to 40 degrees and extension to 15 degrees needed for improved mobility and future return to yardwork   Time 4   Period Weeks   Status Achieved     PT SHORT TERM GOAL #3   Title The patient will have improved pain with usual ADLs at the end of the day and end of the week by 25%    Time 4   Period Weeks   Status On-going  taking Cymbalta     PT SHORT TERM GOAL #4   Title The patient will have improved right hip external rotation to 25 degrees and internal rotation to 15 degrees needed for return to yard work   Time 4   Period Weeks   Status Achieved           PT Long Term Goals - 04/06/17 0919      PT LONG TERM GOAL #1   Title The patient will be independent in safe self progression of HEP for further improvements in ROM, pain and strength  06/01/17   Time 8   Period Weeks   Status New     PT LONG TERM GOAL #2   Title The patient will report a 50% reduction in pain at the end of the day and end of the week   Time 8   Period Weeks   Status New     PT LONG TERM GOAL #3   Title The patient will have improved abdominal and lumbar extensor strength to 4/5 needed for yard work   Time 8   Period Weeks   Status New     PT LONG TERM GOAL #4   Title The patient will have improved hip strength to 4/5 needed for stooping, kneeling needed for yard work   Time 8   Period Weeks   Status New     PT LONG TERM GOAL #5   Title The patient will have improved FOTO from 61% limitation to 44% indicating improved function with less pain   Time 8   Period Weeks   Status New  Plan - 04/17/17 1610    Clinical Impression Statement Patient is independent with initial HEP.  Patient has difficulty with lifting his arms in prone due to  increased thoracic kyphosis.  Patient has significant forward head and tight hamstrings. Patient is able to lift the right leg easier than left in prone.  Patient saw his doctor  due to no change in pain  so the MD placed him on Cymbalta. Patient has increased Hamstring length after rolling the muscle out. Patient is still thinking about dry needling. Patient will benefit form skilled therapy to improve mobility and function while reducing pain.    Rehab Potential Good   Clinical Impairments Affecting Rehab Potential None   PT Frequency 2x / week   PT Duration 8 weeks   PT Treatment/Interventions ADLs/Self Care Home Management;Cryotherapy;Electrical Stimulation;Moist Heat;Ultrasound;Traction;Patient/family education;Neuromuscular re-education;Therapeutic exercise;Therapeutic activities;Manual techniques;Taping;Dry needling   PT Next Visit Plan Dry needling to lumbar and thoracic lumbar junction; core stabilization with abdominal bracing, ; soft tissue work to right hamstring   PT Home Exercise Plan Progress as needed   Consulted and Agree with Plan of Care Patient      Patient will benefit from skilled therapeutic intervention in order to improve the following deficits and impairments:  Decreased activity tolerance, Decreased range of motion, Decreased strength, Hypomobility, Increased fascial restricitons, Pain  Visit Diagnosis: Chronic bilateral low back pain with right-sided sciatica  Muscle weakness (generalized)  Stiffness of right hip, not elsewhere classified     Problem List Patient Active Problem List   Diagnosis Date Noted  . S/P lumbar discectomy 03/18/2016  . Hyperlipidemia 10/27/2009  . ALCOHOL ABUSE 10/27/2009  . HYPERTENSION 10/27/2009    Eulis Foster, PT 04/17/17 8:43 AM   [facility] [address], [address] [place], [place], [address] Phone: [phone]   Fax:  [phone]  Name: Corwyn Vora MRN:  [id number] Date of Birth: 06/12/1976

## 2017-04-17 NOTE — Progress Notes (Signed)
Office Visit Note   Patient: David Salazar           Date of Birth: 1976/01/04           MRN: [id number] Visit Date: 04/11/2017              Requested by: Shirline Frees, NP [address] [place], [place] [address] PCP: Shirline Frees, NP   Assessment & Plan: Visit Diagnoses:  1. Chronic bilateral low back pain with right-sided sciatica   2. S/P lumbar discectomy     Plan: Post March 2017 microdiscectomy right L5-S1 with some persistent right leg symptoms. His strength is improved he still has some lateral numbness. Minimal nerve root tension signs are present and postoperative MRI showed no evidence of disc recurrence. He does have some narrowing at L5-S1 on lateral radiograph.  Follow-Up Instructions: Return in about 3 months (around 07/11/2017).   Orders:  Orders Placed This Encounter  Procedures  . XR Lumbar Spine 2-3 Views   Meds ordered this encounter  Medications  . traMADol (ULTRAM) 50 MG tablet    Sig: Take 1 tablet (50 mg total) by mouth 2 (two) times daily as needed.    Dispense:  15 tablet    Refill:  0      Procedures: No procedures performed   Clinical Data: No additional findings.   Subjective: Chief Complaint  Patient presents with  . Lower Back - Pain    HPI patient returns with postlaminectomy post-discectomy with continued symptoms of back pain and right leg pain. He's tried stretching is kept his weight down is been working on walking program. His symptoms tend to wax and wane. He does not have any left-sided symptoms. Previous MRI showed some left-sided protrusions at L3-4 and L4-5 but they are and asymptomatic. L5-S1 the operative level showed some granulation tissue but no evidence of disease recurrence. He is continuing regular work activities.  Review of Systems 14 point review of systems updated. He's been sober for 8 years. Right L5-S1 microdiscectomy 03/18/2016 with continued intermittent symptoms since that time. He got  temporary relief with epidural steroids by Dr. Alvester Morin. He still has some numbness in his leg and occasionally does use Ultram for his pain. No other changes in his review of systems.   Objective: Vital Signs: BP 121/81   Pulse 77   Ht  (1.93 m)   Wt 215 lb (97.5 kg)   BMI 26.17 kg/m   Physical Exam  Constitutional: He is oriented to person, place, and time. He appears well-developed and well-nourished.  HENT:  Head: Normocephalic and atraumatic.  Eyes: EOM are normal. Pupils are equal, round, and reactive to light.  Neck: No tracheal deviation present. No thyromegaly present.  Cardiovascular: Normal rate.   Pulmonary/Chest: Effort normal. He has no wheezes.  Abdominal: Soft. Bowel sounds are normal.  Neurological: He is alert and oriented to person, place, and time.  Skin: Skin is warm and dry. Capillary refill takes less than 2 seconds.  Psychiatric: He has a normal mood and affect. His behavior is normal. Judgment and thought content normal.    Ortho Exam patient still has some discomfort with straight leg raising at 90 on the right negative on the left. Minimal sciatic notch tenderness on the right well-healed lumbar incision no prominence no tenderness. He can heel and toe walk EHL anterior tib gastrocsoleus and peroneals are all strong. Normal capillary refill distal pulses are normal no calf tenderness. He still has some  lateral foot numbness. He can do single stance toe raises testing is right gastrocsoleus.  Specialty Comments:  No specialty comments available.  Imaging: Study Result   CLINICAL DATA:  Right-sided low back pain and right leg pain. Weakness and numbness in the right leg. Lumbar spine surgery 02/2016.  EXAM: MRI LUMBAR SPINE WITHOUT AND WITH CONTRAST  TECHNIQUE: Multiplanar and multiecho pulse sequences of the lumbar spine were obtained without and with intravenous contrast.  CONTRAST:  19 mL MultiHance  COMPARISON:   02/20/2016  FINDINGS: Segmentation:  Standard.  Alignment:  Normal.  Vertebrae: Preserved vertebral body heights without evidence of fracture. Mild-to-moderate type 1 endplate changes at L5-S1, increased on the right compared to the prior study though decreased the closer to the midline. No suspicious osseous lesion identified.  Conus medullaris: Extends to the T12-L1 level and appears normal.  Paraspinal and other soft tissues: Unremarkable aside from postoperative changes in the posterior soft tissues at L5-S1. No fluid collection.  Disc levels:  L1-2 and L2-3:  Negative.  L3-4: Unchanged disc desiccation and slight disc space height loss. Small left foraminal to extraforaminal disc protrusion is unchanged and does not result in significant stenosis or clear impingement of the exiting left L3 nerve. Mild facet and ligamentum flavum hypertrophy are unchanged.  L4-5: Left foraminal and extraforaminal disc protrusion with annular fissure may irritate the left L4 nerve lateral to the foramen but does not result in significant stenosis, unchanged. Mild facet and ligamentum flavum hypertrophy are unchanged.  L5-S1: Interval right laminectomy. Epidural enhancement is present at the laminectomy site and surrounding the right S1 nerve roots in the lateral recess, compatible with postoperative granulation tissue. No recurrent disc herniation, spinal stenosis, or lateral recess stenosis is seen. Moderate disc space narrowing and mild disc bulging result in minimal right neural foraminal narrowing, unchanged.  IMPRESSION: 1. Interval postoperative changes at L5-S1 without evidence of recurrent disc herniation. Epidural granulation tissue surrounding the right S1 nerve in the lateral recess. 2. Unchanged left-sided disc protrusions at L3-4 and L4-5.   Electronically Signed   By: Sebastian Ache M.D.   On: 10/01/2016 19:25      PMFS History: Patient Active  Problem List   Diagnosis Date Noted  . S/P lumbar discectomy 03/18/2016  . Hyperlipidemia 10/27/2009  . ALCOHOL ABUSE 10/27/2009  . HYPERTENSION 10/27/2009   Past Medical History:  Diagnosis Date  . Alcohol abuse    Sober for 7 years ( 2017).   . Chronic lower back pain    "hopefull not after this OR today" (03/18/2016)  . PONV (postoperative nausea and vomiting)    "just w/my wisdom teeth" (03/18/2016)    Family History  Problem Relation Age of Onset  . Alcohol abuse Father   . Hyperlipidemia Father   . Diabetes Father     Past Surgical History:  Procedure Laterality Date  . BACK SURGERY    . LUMBAR LAMINECTOMY/DECOMPRESSION MICRODISCECTOMY N/A 03/18/2016   Procedure: Right L5-S1 Microdiscectomy;  Surgeon: Eldred Manges, MD;  Location: [facility] OR;  Service: Orthopedics;  Laterality: N/A;  . MICRODISCECTOMY LUMBAR Right 03/18/2016   L5-S1  . WISDOM TOOTH EXTRACTION  1992   Social History   Occupational History  . Not on file.   Social History Main Topics  . Smoking status: Former Smoker    Years: 15.00    Types: E-cigarettes, Cigarettes  . Smokeless tobacco: Never Used     Comment: 03/18/2016 "I use an electronic cigarette"  . Alcohol use No  .  Drug use: No  . Sexual activity: Yes

## 2017-04-19 ENCOUNTER — Encounter: Payer: Self-pay | Admitting: Physical Therapy

## 2017-04-19 ENCOUNTER — Ambulatory Visit: Payer: 59 | Admitting: Physical Therapy

## 2017-04-19 DIAGNOSIS — M25651 Stiffness of right hip, not elsewhere classified: Secondary | ICD-10-CM

## 2017-04-19 DIAGNOSIS — M6281 Muscle weakness (generalized): Secondary | ICD-10-CM

## 2017-04-19 DIAGNOSIS — M5441 Lumbago with sciatica, right side: Secondary | ICD-10-CM | POA: Diagnosis not present

## 2017-04-19 DIAGNOSIS — G8929 Other chronic pain: Secondary | ICD-10-CM

## 2017-04-19 NOTE — Therapy (Signed)
[facility] [address] [place], [place], [address] Phone: [phone]   Fax:  [phone]  Physical Therapy Treatment  Patient Details  Name: David Salazar MRN: [id number] Date of Birth: [birth year] Referring Provider: Shirline Frees  Encounter Date: 04/19/2017      PT End of Session - 04/19/17 0802    Visit Number 4   Number of Visits 20   Date for PT Re-Evaluation 06/01/17   Authorization Type UHC 20 visit limit   Authorization - Visit Number 4   Authorization - Number of Visits 20   PT Start Time 0758   PT Stop Time 0859   PT Time Calculation (min) 61 min   Activity Tolerance Patient tolerated treatment well   Behavior During Therapy [facility] for tasks assessed/performed      Past Medical History:  Diagnosis Date  . Alcohol abuse    Sober for 7 years ( 2017).   . Chronic lower back pain    "hopefull not after this OR today" (03/18/2016)  . PONV (postoperative nausea and vomiting)    "just w/my wisdom teeth" (03/18/2016)    Past Surgical History:  Procedure Laterality Date  . BACK SURGERY    . LUMBAR LAMINECTOMY/DECOMPRESSION MICRODISCECTOMY N/A 03/18/2016   Procedure: Right L5-S1 Microdiscectomy;  Surgeon: Eldred Manges, MD;  Location: [facility] OR;  Service: Orthopedics;  Laterality: N/A;  . MICRODISCECTOMY LUMBAR Right 03/18/2016   L5-S1  . WISDOM TOOTH EXTRACTION  1992    There were no vitals filed for this visit.      Subjective Assessment - 04/19/17 0801    Subjective My neck is feeling pretty good today. The back is tight.    Limitations House hold activities;Sitting   Diagnostic tests last MRI L5-S1 shows degeneration   Patient Stated Goals reduce pain   Currently in Pain? Yes   Pain Score 3    Pain Location Back   Pain Orientation Right;Lower   Pain Descriptors / Indicators Aching;Dull   Pain Type Chronic pain   Pain Onset More than a month ago   Pain Frequency Constant                          [facility] Adult PT Treatment/Exercise - 04/19/17 0001      Lumbar Exercises: Stretches   Active Hamstring Stretch 2 reps;30 seconds  Gastroc stretch     Lumbar Exercises: Standing   Row Power tower   Shoulder Extension Power Tower;Both;20 reps   Other Standing Lumbar Exercises Lat Pull x20     Lumbar Exercises: Supine   Bent Knee Raise 10 reps  abdominal bracing; bil.    Bridge 15 reps  VC to go slowly   Straight Leg Raise 10 reps     Lumbar Exercises: Sidelying   Hip Abduction 10 reps     Lumbar Exercises: Prone   Single Arm Raise 20 reps  bil. with VC on breath and brace abdominals   Straight Leg Raise 20 reps  VC on breath; abdominal bracing   Other Prone Lumbar Exercises head lift x10   Pt reports increased low back pain     Lumbar Exercises: Quadruped   Single Arm Raise 10 reps   Straight Leg Raise 10 reps     Modalities   Modalities Electrical Stimulation;Moist Heat     Moist Heat Therapy   Number Minutes Moist Heat 15 Minutes   Moist Heat Location Lumbar Spine  Programme researcher, broadcasting/film/video Location lumbar in prone   Electrical Stimulation Action IFC   Electrical Stimulation Parameters 15 minutes to tolerance   Electrical Stimulation Goals Pain                  PT Short Term Goals - 04/17/17 0821      PT SHORT TERM GOAL #1   Title The patient will be able initiate a home ex program to improve soft tissue and joint mobility with minimal increase in discomfort  05/04/17   Time 4   Period Weeks   Status Achieved     PT SHORT TERM GOAL #2   Title The patient will have improved lumbar flexion to 40 degrees and extension to 15 degrees needed for improved mobility and future return to yardwork   Time 4   Period Weeks   Status Achieved     PT SHORT TERM GOAL #3   Title The patient will have improved pain with usual ADLs at the end of the day and end of the week by 25%    Time 4   Period  Weeks   Status On-going  taking Cymbalta     PT SHORT TERM GOAL #4   Title The patient will have improved right hip external rotation to 25 degrees and internal rotation to 15 degrees needed for return to yard work   Time 4   Period Weeks   Status Achieved           PT Long Term Goals - 04/06/17 0919      PT LONG TERM GOAL #1   Title The patient will be independent in safe self progression of HEP for further improvements in ROM, pain and strength  06/01/17   Time 8   Period Weeks   Status New     PT LONG TERM GOAL #2   Title The patient will report a 50% reduction in pain at the end of the day and end of the week   Time 8   Period Weeks   Status New     PT LONG TERM GOAL #3   Title The patient will have improved abdominal and lumbar extensor strength to 4/5 needed for yard work   Time 8   Period Weeks   Status New     PT LONG TERM GOAL #4   Title The patient will have improved hip strength to 4/5 needed for stooping, kneeling needed for yard work   Time 8   Period Weeks   Status New     PT LONG TERM GOAL #5   Title The patient will have improved FOTO from 61% limitation to 44% indicating improved function with less pain   Time 8   Period Weeks   Status New               Plan - 04/19/17 0820    Clinical Impression Statement Pt presents with thracic hyphosis and low back pain. Pt did well with all supine and standing exercises. Most difficulty with prone exercises due to patients excessive forward flexed posture. Continues to have tightness in Right hamstring with limited hip flexion in supine. Pt will continue to benefit from skilled therapy for core strength and stability.    Rehab Potential Good   Clinical Impairments Affecting Rehab Potential None   PT Frequency 2x / week   PT Duration 8 weeks   PT Treatment/Interventions ADLs/Self Care Home Management;Cryotherapy;Electrical Stimulation;Moist Heat;Ultrasound;Traction;Patient/family education;Neuromuscular  re-education;Therapeutic  exercise;Therapeutic activities;Manual techniques;Taping;Dry needling   PT Next Visit Plan Postural strengthening, core stability   Consulted and Agree with Plan of Care Patient      Patient will benefit from skilled therapeutic intervention in order to improve the following deficits and impairments:  Decreased activity tolerance, Decreased range of motion, Decreased strength, Hypomobility, Increased fascial restricitons, Pain  Visit Diagnosis: Chronic bilateral low back pain with right-sided sciatica  Muscle weakness (generalized)  Stiffness of right hip, not elsewhere classified     Problem List Patient Active Problem List   Diagnosis Date Noted  . S/P lumbar discectomy 03/18/2016  . Hyperlipidemia 10/27/2009  . ALCOHOL ABUSE 10/27/2009  . HYPERTENSION 10/27/2009    Dessa Phi PTA 04/19/2017, 8:46 AM  [facility] [address] [place], [place], [address] Phone: [phone]   Fax:  [phone]  Name: David Salazar MRN: [id number] Date of Birth: March 26, 1976

## 2017-04-20 ENCOUNTER — Telehealth (INDEPENDENT_AMBULATORY_CARE_PROVIDER_SITE_OTHER): Payer: Self-pay | Admitting: Orthopaedic Surgery

## 2017-04-20 NOTE — Telephone Encounter (Signed)
noted 

## 2017-04-20 NOTE — Telephone Encounter (Signed)
I called . He can give a couple more weeks and if not working then we will stop cymbalta and we can try lyrica. FYI

## 2017-04-20 NOTE — Telephone Encounter (Signed)
Pt requested a call back, he has a question about a medication he is taking.  [phone]

## 2017-04-20 NOTE — Telephone Encounter (Signed)
Patient has not noticed any real effects from the Cymbalta yet. He has a friend who got great results from Lyrica. He did not know if this may be an option for what is going on with him. I did advise that the Cymbalta would not change his symptoms overnight and that this is something that builds in the system. Please advise if you think he would benefit from Lyrica instead, or if you want him to continue the Cymbalta.

## 2017-04-21 ENCOUNTER — Ambulatory Visit (INDEPENDENT_AMBULATORY_CARE_PROVIDER_SITE_OTHER): Payer: 59 | Admitting: Orthopaedic Surgery

## 2017-04-24 ENCOUNTER — Encounter: Payer: Self-pay | Admitting: Physical Therapy

## 2017-04-24 ENCOUNTER — Ambulatory Visit: Payer: 59 | Admitting: Physical Therapy

## 2017-04-24 DIAGNOSIS — G8929 Other chronic pain: Secondary | ICD-10-CM

## 2017-04-24 DIAGNOSIS — M5441 Lumbago with sciatica, right side: Secondary | ICD-10-CM | POA: Diagnosis not present

## 2017-04-24 DIAGNOSIS — M6281 Muscle weakness (generalized): Secondary | ICD-10-CM

## 2017-04-24 DIAGNOSIS — M25651 Stiffness of right hip, not elsewhere classified: Secondary | ICD-10-CM

## 2017-04-24 NOTE — Therapy (Addendum)
[facility] [address], [place], [place], [address] Phone: [phone]   Fax:  [phone]  Physical Therapy Treatment  Patient Details  Name: David Salazar MRN: [id number] Date of Birth: December 10, 1976 Referring Provider: Dorothyann Peng  Encounter Date: 04/24/2017      PT End of Session - 04/24/17 0804    Visit Number 5   Number of Visits 20   Date for PT Re-Evaluation 06/01/17   Authorization Type [employer] 20 visit limit   Authorization - Visit Number 5   Authorization - Number of Visits 20   PT Start Time 0800   PT Stop Time 6568   PT Time Calculation (min) 44 min   Activity Tolerance Patient tolerated treatment well   Behavior During Therapy [facility] for tasks assessed/performed      Past Medical History:  Diagnosis Date  . Alcohol abuse    Sober for 7 years ( 2017).   . Chronic lower back pain    "hopefull not after this OR today" (03/18/2016)  . PONV (postoperative nausea and vomiting)    "just w/my wisdom teeth" (03/18/2016)    Past Surgical History:  Procedure Laterality Date  . BACK SURGERY    . LUMBAR LAMINECTOMY/DECOMPRESSION MICRODISCECTOMY N/A 03/18/2016   Procedure: Right L5-S1 Microdiscectomy;  Surgeon: Marybelle Killings, MD;  Location: [place];  Service: Orthopedics;  Laterality: N/A;  . MICRODISCECTOMY LUMBAR Right 03/18/2016   L5-S1  . WISDOM TOOTH EXTRACTION  1992    There were no vitals filed for this visit.      Subjective Assessment - 04/24/17 0755    Subjective back not bad todya, yesrterday was a litle bit sore. It's just barley there today.    Limitations House hold activities;Sitting   Diagnostic tests last MRI L5-S1 shows degeneration   Patient Stated Goals reduce pain   Currently in Pain? Yes   Pain Score 1    Pain Location Back   Pain Orientation Right;Lower   Pain Descriptors / Indicators Aching;Dull   Pain Type Chronic pain   Pain Onset More than a month ago   Pain Frequency  Constant   Aggravating Factors  randomly; sitting at work; end of the week   Pain Relieving Factors walking, supine with legs elevated, heat   Multiple Pain Sites No                         [facility] Adult PT Treatment/Exercise - 04/24/17 0001      Therapeutic Activites    Therapeutic Activities ADL's   ADL's Standing, walking, transfers     Neuro Re-ed    Neuro Re-ed Details  Activation of deep core stabilizers, activation of upper back stabilizers     Lumbar Exercises: Stretches   Active Hamstring Stretch 2 reps;30 seconds  Gastroc stretch   Prone Mid Back Stretch --  Doorway chest stretch     Lumbar Exercises: Machines for Strengthening   Leg Press # 90 Bil 3x10   Other Lumbar Machine Exercise Walking with sports cord  backwards #35 x8     Lumbar Exercises: Standing   Row Power tower  #35   Shoulder Extension Power Tower;Both;20 reps  #35   Other Standing Lumbar Exercises Lat Pull x20   Other Standing Lumbar Exercises 3 direction hip kicks  2x10     Lumbar Exercises: Prone   Single Arm Raise 20 reps  Single, Bil with head lift   Straight Leg  Raise 20 reps  VC on breath; abdominal bracing   Other Prone Lumbar Exercises --     Lumbar Exercises: Quadruped   Single Arm Raise 10 reps   Straight Leg Raise 10 reps                  PT Short Term Goals - 04/24/17 0845      PT SHORT TERM GOAL #3   Title The patient will have improved pain with usual ADLs at the end of the day and end of the week by 25%    Time 4   Status On-going     PT SHORT TERM GOAL #4   Title The patient will have improved right hip external rotation to 25 degrees and internal rotation to 15 degrees needed for return to yard work   Status Achieved           PT Long Term Goals - 04/24/17 0845      PT LONG TERM GOAL #1   Title The patient will be independent in safe self progression of HEP for further improvements in ROM, pain and strength  06/01/17   Time 8    Period Weeks   Status On-going     PT LONG TERM GOAL #2   Title The patient will report a 50% reduction in pain at the end of the day and end of the week   Time 8   Period Weeks   Status On-going     PT LONG TERM GOAL #3   Title The patient will have improved abdominal and lumbar extensor strength to 4/5 needed for yard work   Time 8   Period Weeks   Status On-going     PT LONG TERM GOAL #4   Title The patient will have improved hip strength to 4/5 needed for stooping, kneeling needed for yard work   Time 8   Period Weeks   Status On-going     PT LONG TERM GOAL #5   Title The patient will have improved FOTO from 61% limitation to 44% indicating improved function with less pain   Time 8   Period Weeks   Status On-going               Plan - 04/24/17 0843    Clinical Impression Statement Patient reports less low back pain today. Able to tolerate all strengthening exercises well. Continues to have some difficulty with supine extension exercises due to increased low back pain. Patient will continue to benefit from skilled thearpy for postural strengthening and core stability.    Rehab Potential Good   Clinical Impairments Affecting Rehab Potential None   PT Frequency 2x / week   PT Duration 8 weeks   PT Treatment/Interventions ADLs/Self Care Home Management;Cryotherapy;Electrical Stimulation;Moist Heat;Ultrasound;Traction;Patient/family education;Neuromuscular re-education;Therapeutic exercise;Therapeutic activities;Manual techniques;Taping;Dry needling   PT Next Visit Plan Seated on ball, UE strengthening   PT Home Exercise Plan Banded exercises   Consulted and Agree with Plan of Care Patient      Patient will benefit from skilled therapeutic intervention in order to improve the following deficits and impairments:  Decreased activity tolerance, Decreased range of motion, Decreased strength, Hypomobility, Increased fascial restricitons, Pain  Visit Diagnosis: Chronic  bilateral low back pain with right-sided sciatica  Muscle weakness (generalized)  Stiffness of right hip, not elsewhere classified     Problem List Patient Active Problem List   Diagnosis Date Noted  . S/P lumbar discectomy 03/18/2016  . Hyperlipidemia 10/27/2009  .  ALCOHOL ABUSE 10/27/2009  . HYPERTENSION 10/27/2009    Mikle Bosworth PTA 04/24/2017, 8:48 AM  [facility] [address], [place], [place], [address] Phone: [phone]   Fax:  [phone]  Name: David Salazar MRN: [id number] Date of Birth: 29-Dec-1975  PHYSICAL THERAPY DISCHARGE SUMMARY  Visits from Start of Care: 5  Current functional level related to goals / functional outcomes: See above. Patient called to cancel all of his visits with no reason.    Remaining deficits: See above.    Education / Equipment: HEP Plan: Patient agrees to discharge.  Patient goals were partially met. Patient is being discharged due to not returning since the last visit. Thank you for the referral. Earlie Counts, PT 05/03/17 10:19 AM   ?????

## 2017-04-26 ENCOUNTER — Encounter: Payer: 59 | Admitting: Physical Therapy

## 2017-05-01 ENCOUNTER — Encounter: Payer: 59 | Admitting: Physical Therapy

## 2017-05-03 ENCOUNTER — Ambulatory Visit: Payer: Self-pay | Admitting: Physical Therapy

## 2017-05-04 ENCOUNTER — Telehealth (INDEPENDENT_AMBULATORY_CARE_PROVIDER_SITE_OTHER): Payer: Self-pay | Admitting: Orthopaedic Surgery

## 2017-05-04 NOTE — Telephone Encounter (Signed)
Patient called wanting to speak with you about his back issues. CB # [phone]

## 2017-05-05 MED ORDER — TRAMADOL HCL 50 MG PO TABS: 50.0000 mg | ORAL_TABLET | Freq: Two times a day (BID) | ORAL | 0 refills | Status: DC | PRN

## 2017-05-05 NOTE — Telephone Encounter (Signed)
I called and advised Tramadol had been refilled. I also explained that David Salazar wanted David Salazar to be the one to stop Cymbalta and start Lyrica since they had discussed it. Patient aware that he will not get return call about this until David Salazar available in office.   Please advise on switch to Lyrica.

## 2017-05-05 NOTE — Telephone Encounter (Signed)
Per Zonia KiefJames Owens, PA-C, ok to refill Tramadol. Dr. Ophelia CharterYates will need to make decision on stopping Cymbalta and starting Lyrica.

## 2017-05-05 NOTE — Addendum Note (Signed)
Addended by: Rogers SeedsYEATTS, Renold Kozar M on: 05/05/2017 10:26 AM   Modules accepted: Orders

## 2017-05-05 NOTE — Telephone Encounter (Signed)
Patient states back is still bothering him. He requests a refill on his Tramadol and would like to try the Lyrica as you had discussed previously. I did advise that Dr. Ophelia CharterYates is out of office and will not be back until Monday.

## 2017-05-05 NOTE — Telephone Encounter (Signed)
I left voicemail for return call. 

## 2017-05-08 MED ORDER — PREGABALIN 100 MG PO CAPS: ORAL_CAPSULE | ORAL | 0 refills | Status: DC

## 2017-05-08 NOTE — Telephone Encounter (Signed)
Stop cymbalta, start lyrica 100mg  po q hs for one week then one po bid.   #  60. thanks

## 2017-05-08 NOTE — Addendum Note (Signed)
Addended by: Rogers SeedsYEATTS, Julus Kelley M on: 05/08/2017 03:49 PM   Modules accepted: Orders

## 2017-05-08 NOTE — Telephone Encounter (Signed)
I left voicemail for patient advising. I called Lyrica into patient's pharmacy. Did explain to patient that he CANNOT take Lyrica and Cymbalta together.

## 2017-05-31 ENCOUNTER — Telehealth (INDEPENDENT_AMBULATORY_CARE_PROVIDER_SITE_OTHER): Payer: Self-pay | Admitting: Orthopaedic Surgery

## 2017-05-31 NOTE — Telephone Encounter (Signed)
Patient called having a question about lyrica? CB # [phone]

## 2017-06-01 ENCOUNTER — Telehealth (INDEPENDENT_AMBULATORY_CARE_PROVIDER_SITE_OTHER): Payer: Self-pay

## 2017-06-01 NOTE — Telephone Encounter (Signed)
Patient called again concerning Lyrica.

## 2017-06-02 ENCOUNTER — Other Ambulatory Visit (INDEPENDENT_AMBULATORY_CARE_PROVIDER_SITE_OTHER): Payer: Self-pay | Admitting: Family

## 2017-06-02 MED ORDER — PREGABALIN 100 MG PO CAPS: ORAL_CAPSULE | ORAL | 3 refills | Status: DC

## 2017-06-02 NOTE — Telephone Encounter (Signed)
Patient is going out of town next week. He would like a refill on his Lyrica which really seems to be helping with his back problems. Can you advise or would you like for me to hold for Dr. Ophelia CharterYates? Thanks.

## 2017-06-02 NOTE — Telephone Encounter (Signed)
I called to pharmacy. Left voicemail for patient advising.

## 2017-06-02 NOTE — Telephone Encounter (Signed)
duplicate

## 2017-07-11 ENCOUNTER — Ambulatory Visit (INDEPENDENT_AMBULATORY_CARE_PROVIDER_SITE_OTHER): Payer: 59 | Admitting: Adult Health

## 2017-07-11 ENCOUNTER — Encounter: Payer: Self-pay | Admitting: Adult Health

## 2017-07-11 ENCOUNTER — Ambulatory Visit (INDEPENDENT_AMBULATORY_CARE_PROVIDER_SITE_OTHER): Payer: 59 | Admitting: Orthopaedic Surgery

## 2017-07-11 VITALS — BP 132/62 | HR 114 | Temp 98.2°F | Ht 76.0 in | Wt 209.0 lb

## 2017-07-11 DIAGNOSIS — F329 Major depressive disorder, single episode, unspecified: Secondary | ICD-10-CM

## 2017-07-11 DIAGNOSIS — F419 Anxiety disorder, unspecified: Secondary | ICD-10-CM

## 2017-07-11 MED ORDER — CITALOPRAM HYDROBROMIDE 20 MG PO TABS: 20.0000 mg | ORAL_TABLET | Freq: Every day | ORAL | 3 refills | Status: DC

## 2017-07-11 NOTE — Progress Notes (Signed)
Subjective:    Patient ID: David Salazar, male    DOB: [birth year], 41 y.o.   MRN: [id number]  Anxiety  Presents for initial visit. Onset was 1 to 5 years ago. The problem has been gradually worsening. Symptoms include chest pain, depressed mood, irritability, nervous/anxious behavior, palpitations, panic and restlessness. Patient reports no nausea, shortness of breath or suicidal ideas. Symptoms occur most days. The severity of symptoms is interfering with daily activities. The symptoms are aggravated by work stress.   Past treatments include lifestyle changes. The treatment provided no relief.      Review of Systems  Constitutional: Positive for irritability.  Respiratory: Negative for shortness of breath.   Cardiovascular: Positive for chest pain and palpitations.  Gastrointestinal: Negative for nausea.  Psychiatric/Behavioral: Negative for suicidal ideas. The patient is nervous/anxious.    Past Medical History:  Diagnosis Date  . Alcohol abuse    Sober for 7 years ( 2017).   . Chronic lower back pain    "hopefull not after this OR today" (03/18/2016)  . PONV (postoperative nausea and vomiting)    "just w/my wisdom teeth" (03/18/2016)    Social History   Social History  . Marital status: Married    Spouse name: N/A  . Number of children: N/A  . Years of education: N/A   Occupational History  . Not on file.   Social History Main Topics  . Smoking status: Former Smoker    Years: 15.00    Types: E-cigarettes, Cigarettes  . Smokeless tobacco: Never Used     Comment: 03/18/2016 "I use an electronic cigarette"  . Alcohol use No  . Drug use: No  . Sexual activity: Yes   Other Topics Concern  . Not on file   Social History Narrative  . No narrative on file    Past Surgical History:  Procedure Laterality Date  . BACK SURGERY    . LUMBAR LAMINECTOMY/DECOMPRESSION MICRODISCECTOMY N/A 03/18/2016   Procedure: Right L5-S1 Microdiscectomy;  Surgeon: Eldred MangesMark C Yates,  MD;  [facility];  Service: Orthopedics;  Laterality: N/A;  . MICRODISCECTOMY LUMBAR Right 03/18/2016   L5-S1  . WISDOM TOOTH EXTRACTION  1992    Family History  Problem Relation Age of Onset  . Alcohol abuse Father   . Hyperlipidemia Father   . Diabetes Father     No Known Allergies  Current Outpatient Prescriptions on File Prior to Visit  Medication Sig Dispense Refill  . ibuprofen (ADVIL,MOTRIN) 200 MG tablet Take 200 mg by mouth every 6 (six) hours as needed.    . pregabalin (LYRICA) 100 MG capsule Take once capsule (100mg ) at night for one week, then one capsule twice daily. 60 capsule 3   No current facility-administered medications on file prior to visit.     BP 132/62 (BP Location: Left Arm, Patient Position: Sitting, Cuff Size: Normal)   Pulse (!) 114   Temp 98.2 F (36.8 C) (Oral)   Ht 6\' 4"  (1.93 m)   Wt 209 lb (94.8 kg)   SpO2 98%   BMI 25.44 kg/m       Objective:   Physical Exam  Constitutional: He is oriented to person, place, and time. He appears well-developed and well-nourished. No distress.  Cardiovascular: Normal rate, regular rhythm, normal heart sounds and intact distal pulses.  Exam reveals no gallop and no friction rub.   No murmur heard. Pulmonary/Chest: Effort normal and breath sounds normal. No respiratory distress. He has no wheezes. He has no  rales. He exhibits no tenderness.  Neurological: He is alert and oriented to person, place, and time.  Skin: Skin is warm and dry. No rash noted. He is not diaphoretic. No erythema. No pallor.  Psychiatric: His behavior is normal. Judgment and thought content normal. His mood appears anxious. Cognition and memory are normal.  Nursing note and vitals reviewed.     Assessment & Plan:  1. Anxiety and depression - citalopram (CELEXA) 20 MG tablet; Take 1 tablet (20 mg total) by mouth daily.  Dispense: 30 tablet; Refill: 3 - Follow up in one month  - Stop taking medication and go to the ER with any  thoughts of SI   Shirline Frees, NP

## 2017-07-18 ENCOUNTER — Telehealth: Payer: Self-pay | Admitting: Adult Health

## 2017-07-18 NOTE — Telephone Encounter (Signed)
Pt has a question about the medication (CELEXA)  that he just started and would like to have a call back.

## 2017-07-18 NOTE — Telephone Encounter (Signed)
Spoke to the pt.  He states he feels like the citalopram (CELEXA) 20 MG tablet is making his anxiety worse.  Has been on the medication for 1 week today.  He is taking it an night.  States he feels like he is going to "jump out of his skin" when he wakes in the morning.  He did not know if this was a common side effect.  Will forward to [facility].

## 2017-07-18 NOTE — Telephone Encounter (Signed)
Hey can cut the pill in half and take 10 mg. This can be a side effect of too much medication

## 2017-07-18 NOTE — Telephone Encounter (Signed)
Left a message for a return call.

## 2017-07-19 NOTE — Telephone Encounter (Signed)
Pt returned your call. Please call back.

## 2017-07-19 NOTE — Telephone Encounter (Signed)
Pt notified to cut his tablet in half.  Asked if he would call back in one week to let me know how that is working for him.  Will hold note.

## 2017-07-26 ENCOUNTER — Ambulatory Visit (INDEPENDENT_AMBULATORY_CARE_PROVIDER_SITE_OTHER): Payer: 59 | Admitting: Orthopaedic Surgery

## 2017-07-26 ENCOUNTER — Encounter (INDEPENDENT_AMBULATORY_CARE_PROVIDER_SITE_OTHER): Payer: Self-pay | Admitting: Orthopaedic Surgery

## 2017-07-26 VITALS — BP 114/76 | HR 70 | Ht 76.0 in | Wt 210.0 lb

## 2017-07-26 DIAGNOSIS — Z9889 Other specified postprocedural states: Secondary | ICD-10-CM

## 2017-07-26 MED ORDER — PREGABALIN 100 MG PO CAPS: ORAL_CAPSULE | ORAL | 3 refills | Status: DC

## 2017-07-26 NOTE — Telephone Encounter (Signed)
Left a message for a return call.

## 2017-07-26 NOTE — Progress Notes (Signed)
Office Visit Note   Patient: David Salazar           Date of Birth: 28-Apr-1976           MRN: [id number] Visit Date: 07/26/2017              Requested by: Shirline FreesNafziger, Cory, NP [address] [place], [place] [id number] PCP: Shirline FreesNafziger, Cory, NP   Assessment & Plan: Visit Diagnoses:  1. S/P lumbar discectomy       Right L5-S1 with surgery March 2017  Plan: Continue Lyrica. He doesn't have to take the ibuprofen regularly since the Lyrica is given him improvement. He should get gradual improvement in his symptoms and I'll recheck him in 3 months.  Follow-Up Instructions: Return in about 3 months (around 10/26/2017).   Orders:  No orders of the defined types were placed in this encounter.  Meds ordered this encounter  Medications  . pregabalin (LYRICA) 100 MG capsule    Sig: Take once capsule (100mg ) at night for one week, then one capsule twice daily.    Dispense:  60 capsule    Refill:  3      Procedures: No procedures performed   Clinical Data: No additional findings.   Subjective: Chief Complaint  Patient presents with  . Lower Back - Follow-up    HPI patient returns post right L5-S1 microdiscectomy March 2017. He's got some gradual improvement in the symptoms with the Lyrica. He's on 100 mg twice a day. He still has some discomfort and slight numbness laterally in the calf lateral midfoot. He is ambulatory and is back working out again. Denies bowel or bladder symptoms no fever chills lumbar incision is well-healed. MRI scan last fall postop showed some scar tissue around the nerve but no disc recurrence and he did have narrowing of the disc space. Additionally there is desiccation of L3-4 and L4-5 with some lateral protrusion on the left at L4-5 which is asymptomatic.  Review of Systems 14 point review of systems updated and unchanged from 04/11/2017. He had microdiscectomy March 2017 had epidural steroid injections. He had been an occasional Ultram now just  taking Lyrica and rarely ibuprofen. Positive for loss of over 90 pounds with exercise over the last several years and diet. History of alcohol abuse hyperlipidemia hypertension. Otherwise negative.   Objective: Vital Signs: BP 114/76   Pulse 70   Ht 6\' 4"  (1.93 m)   Wt 210 lb (95.3 kg)   BMI 25.56 kg/m   Physical Exam  Constitutional: He is oriented to person, place, and time. He appears well-developed and well-nourished.  HENT:  Head: Normocephalic and atraumatic.  Eyes: Pupils are equal, round, and reactive to light. EOM are normal.  Neck: No tracheal deviation present. No thyromegaly present.  Cardiovascular: Normal rate.   Pulmonary/Chest: Effort normal. He has no wheezes.  Abdominal: Soft. Bowel sounds are normal.  Musculoskeletal:  Well-healed lumbar incision. Some discomfort straight leg raising 90 on the right. Ankle jerk is 1+ on the right. Gastrocsoleus is strong peroneal's are strong. He is a mature without a limp.  Neurological: He is alert and oriented to person, place, and time.  Skin: Skin is warm and dry. Capillary refill takes less than 2 seconds.  Psychiatric: He has a normal mood and affect. His behavior is normal. Judgment and thought content normal.    Ortho Exam  Specialty Comments:  No specialty comments available.  Imaging: No results found.   PMFS History: Patient Active Problem  List   Diagnosis Date Noted  . S/P lumbar discectomy 03/18/2016  . Hyperlipidemia 10/27/2009  . ALCOHOL ABUSE 10/27/2009  . HYPERTENSION 10/27/2009   Past Medical History:  Diagnosis Date  . Alcohol abuse    Sober for 7 years ( 2017).   . Chronic lower back pain    "hopefull not after this OR today" (03/18/2016)  . PONV (postoperative nausea and vomiting)    "just w/my wisdom teeth" (03/18/2016)    Family History  Problem Relation Age of Onset  . Alcohol abuse Father   . Hyperlipidemia Father   . Diabetes Father     Past Surgical History:  Procedure  Laterality Date  . BACK SURGERY    . LUMBAR LAMINECTOMY/DECOMPRESSION MICRODISCECTOMY N/A 03/18/2016   Procedure: Right L5-S1 Microdiscectomy;  Surgeon: Eldred MangesMark C Ladiamond Gallina, MD;  [facility];  Service: Orthopedics;  Laterality: N/A;  . MICRODISCECTOMY LUMBAR Right 03/18/2016   L5-S1  . WISDOM TOOTH EXTRACTION  1992   Social History   Occupational History  . Not on file.   Social History Main Topics  . Smoking status: Former Smoker    Years: 15.00    Types: E-cigarettes, Cigarettes  . Smokeless tobacco: Never Used     Comment: 03/18/2016 "I use an electronic cigarette"  . Alcohol use No  . Drug use: No  . Sexual activity: Yes

## 2017-08-01 ENCOUNTER — Ambulatory Visit (INDEPENDENT_AMBULATORY_CARE_PROVIDER_SITE_OTHER): Payer: 59 | Admitting: Orthopaedic Surgery

## 2017-08-10 ENCOUNTER — Ambulatory Visit (INDEPENDENT_AMBULATORY_CARE_PROVIDER_SITE_OTHER): Payer: 59 | Admitting: Adult Health

## 2017-08-10 ENCOUNTER — Encounter: Payer: Self-pay | Admitting: Adult Health

## 2017-08-10 VITALS — BP 98/60 | Temp 98.1°F | Wt 205.0 lb

## 2017-08-10 DIAGNOSIS — Z23 Encounter for immunization: Secondary | ICD-10-CM

## 2017-08-10 DIAGNOSIS — F419 Anxiety disorder, unspecified: Secondary | ICD-10-CM

## 2017-08-10 DIAGNOSIS — F329 Major depressive disorder, single episode, unspecified: Secondary | ICD-10-CM | POA: Diagnosis not present

## 2017-08-10 MED ORDER — ESCITALOPRAM OXALATE 5 MG PO TABS: 5.0000 mg | ORAL_TABLET | Freq: Every day | ORAL | 3 refills | Status: DC

## 2017-08-10 NOTE — Progress Notes (Signed)
Subjective:    Patient ID: David Salazar, male    DOB: [birth year], 41 y.o.   MRN: [id number]  HPI  41 year old male who  has a past medical history of Alcohol abuse; Chronic lower back pain; and PONV (postoperative nausea and vomiting). He presents to the office today for one month follow up regarding depression and anxiety. During his last visit he was started on Celexa 20 mg. About one week into treatment he felt as though Celexa 20 mg was making his anxiety worse and he felt as though " I was jumping out of my skin". He was then advised to cut the pill in half.   Today in the office he reports that he stopped taking Celexa as he did not like the way it made him feel, he continued to feel as though it was making him more anxious and was having severe abdominal pain   His wife takes Lexapro and he would like to try this    Review of Systems  Constitutional: Negative.   HENT: Negative.   Respiratory: Negative.   Cardiovascular: Negative.   Genitourinary: Negative.   Skin: Negative.   Neurological: Negative.   Psychiatric/Behavioral: Negative for suicidal ideas. The patient is nervous/anxious. The patient is not hyperactive.   All other systems reviewed and are negative.  Past Medical History:  Diagnosis Date  . Alcohol abuse    Sober for 7 years ( 2017).   . Chronic lower back pain    "hopefull not after this OR today" (03/18/2016)  . PONV (postoperative nausea and vomiting)    "just w/my wisdom teeth" (03/18/2016)    Social History   Social History  . Marital status: Married    Spouse name: N/A  . Number of children: N/A  . Years of education: N/A   Occupational History  . Not on file.   Social History Main Topics  . Smoking status: Former Smoker    Years: 15.00    Types: E-cigarettes, Cigarettes  . Smokeless tobacco: Never Used     Comment: 03/18/2016 "I use an electronic cigarette"  . Alcohol use No  . Drug use: No  . Sexual activity: Yes   Other  Topics Concern  . Not on file   Social History Narrative  . No narrative on file    Past Surgical History:  Procedure Laterality Date  . BACK SURGERY    . LUMBAR LAMINECTOMY/DECOMPRESSION MICRODISCECTOMY N/A 03/18/2016   Procedure: Right L5-S1 Microdiscectomy;  Surgeon: Eldred MangesMark C Yates, MD;  [facility];  Service: Orthopedics;  Laterality: N/A;  . MICRODISCECTOMY LUMBAR Right 03/18/2016   L5-S1  . WISDOM TOOTH EXTRACTION  1992    Family History  Problem Relation Age of Onset  . Alcohol abuse Father   . Hyperlipidemia Father   . Diabetes Father     No Known Allergies  Current Outpatient Prescriptions on File Prior to Visit  Medication Sig Dispense Refill  . citalopram (CELEXA) 20 MG tablet Take 1 tablet (20 mg total) by mouth daily. 30 tablet 3  . ibuprofen (ADVIL,MOTRIN) 200 MG tablet Take 200 mg by mouth every 6 (six) hours as needed.    . pregabalin (LYRICA) 100 MG capsule Take once capsule (100mg ) at night for one week, then one capsule twice daily. 60 capsule 3   No current facility-administered medications on file prior to visit.     There were no vitals taken for this visit.      Objective:  Physical Exam  Constitutional: He is oriented to person, place, and time. He appears well-developed and well-nourished. No distress.  Cardiovascular: Normal rate, regular rhythm, normal heart sounds and intact distal pulses.  Exam reveals no gallop and no friction rub.   No murmur heard. Pulmonary/Chest: Effort normal and breath sounds normal. No respiratory distress. He has no wheezes. He has no rales. He exhibits no tenderness.  Neurological: He is alert and oriented to person, place, and time.  Skin: Skin is warm and dry. No rash noted. He is not diaphoretic. No erythema. No pallor.  Psychiatric: He has a normal mood and affect. His behavior is normal. Judgment and thought content normal.  Nursing note and vitals reviewed.     Assessment & Plan:  1. Anxiety and  depression - escitalopram (LEXAPRO) 5 MG tablet; Take 1 tablet (5 mg total) by mouth daily.  Dispense: 30 tablet; Refill: 3 - Follow up in one month or sooner if needed  2. Need for Tdap vaccination  - Tdap vaccine greater than or equal to 7yo IM  Shirline Frees, NP

## 2017-08-29 ENCOUNTER — Telehealth (INDEPENDENT_AMBULATORY_CARE_PROVIDER_SITE_OTHER): Payer: Self-pay | Admitting: Orthopaedic Surgery

## 2017-08-29 ENCOUNTER — Other Ambulatory Visit (INDEPENDENT_AMBULATORY_CARE_PROVIDER_SITE_OTHER): Payer: Self-pay | Admitting: Orthopaedic Surgery

## 2017-08-29 MED ORDER — PREGABALIN 100 MG PO CAPS: ORAL_CAPSULE | ORAL | 3 refills | Status: DC

## 2017-08-29 NOTE — Telephone Encounter (Signed)
Ok for refill? 

## 2017-08-29 NOTE — Telephone Encounter (Signed)
Ok thanks 

## 2017-08-29 NOTE — Telephone Encounter (Signed)
Called to pharmacy. I called patient and advised. 

## 2017-08-29 NOTE — Telephone Encounter (Signed)
Patient called needing Rx refilled (Lyrica) The number to contact patient is [phone]

## 2017-09-15 ENCOUNTER — Encounter: Payer: Self-pay | Admitting: Adult Health

## 2017-09-28 ENCOUNTER — Telehealth (INDEPENDENT_AMBULATORY_CARE_PROVIDER_SITE_OTHER): Payer: Self-pay | Admitting: Orthopaedic Surgery

## 2017-09-28 NOTE — Telephone Encounter (Signed)
Patient called wanting to speak to [facility] about his back. Wants you to call him back. No additional information given. Please call patient. [phone]

## 2017-09-28 NOTE — Telephone Encounter (Signed)
I left voicemail for patient asking for return call. 

## 2017-10-03 NOTE — Telephone Encounter (Signed)
Left voicemail x 2 for return call.

## 2017-10-31 ENCOUNTER — Ambulatory Visit (INDEPENDENT_AMBULATORY_CARE_PROVIDER_SITE_OTHER): Payer: 59 | Admitting: Orthopaedic Surgery

## 2017-11-14 ENCOUNTER — Ambulatory Visit (INDEPENDENT_AMBULATORY_CARE_PROVIDER_SITE_OTHER): Payer: 59 | Admitting: Orthopaedic Surgery

## 2018-02-06 ENCOUNTER — Other Ambulatory Visit: Payer: Self-pay | Admitting: Internal Medicine

## 2018-02-06 DIAGNOSIS — M542 Cervicalgia: Secondary | ICD-10-CM

## 2018-02-11 ENCOUNTER — Ambulatory Visit
Admission: RE | Admit: 2018-02-11 | Discharge: 2018-02-11 | Disposition: A | Discharge status: Home / Self Care | Payer: 59 | Source: Ambulatory Visit | Attending: Internal Medicine | Admitting: Internal Medicine

## 2018-02-11 DIAGNOSIS — M542 Cervicalgia: Secondary | ICD-10-CM

## 2018-02-11 MED ORDER — GADOBENATE DIMEGLUMINE 529 MG/ML IV SOLN
19.0000 mL | Freq: Once | INTRAVENOUS | Status: AC | PRN
  Administered 2018-02-11: 19 mL via INTRAVENOUS

## 2018-03-02 ENCOUNTER — Ambulatory Visit (INDEPENDENT_AMBULATORY_CARE_PROVIDER_SITE_OTHER): Payer: 59 | Admitting: Orthopaedic Surgery

## 2018-05-02 ENCOUNTER — Encounter: Payer: Self-pay | Admitting: Adult Health

## 2018-05-02 ENCOUNTER — Ambulatory Visit: Payer: 59 | Admitting: Adult Health

## 2018-05-02 DIAGNOSIS — F419 Anxiety disorder, unspecified: Secondary | ICD-10-CM

## 2018-05-02 DIAGNOSIS — F32A Depression, unspecified: Secondary | ICD-10-CM

## 2018-05-02 DIAGNOSIS — F329 Major depressive disorder, single episode, unspecified: Secondary | ICD-10-CM | POA: Diagnosis not present

## 2018-05-02 MED ORDER — ESCITALOPRAM OXALATE 5 MG PO TABS: 5.0000 mg | ORAL_TABLET | Freq: Every day | ORAL | 2 refills | Status: DC

## 2018-05-02 MED ORDER — ALPRAZOLAM 0.25 MG PO TABS: 0.2500 mg | ORAL_TABLET | Freq: Two times a day (BID) | ORAL | 0 refills | Status: DC | PRN

## 2018-05-02 NOTE — Progress Notes (Signed)
Subjective:    Patient ID: David Salazar, male    DOB: [birth year], 42 y.o.   MRN: [id number]  HPI  42 year old male who  has a past medical history of Alcohol abuse, Chronic lower back pain, and PONV (postoperative nausea and vomiting). He presents to the office today with the complaint of anxiety. He has been prescribed Lexapro 5 mg in the past and did well with this medication but stopped taking it in December 2018 as he was feeling improved. He reports increased anxiety over the last 1-2 months. Anxiety is stemming from increased responsibilities at work as well as selling his home and moving into a new one. Denies any depression or SI   He would like to go back on Lexapro and be seen by Psychiatry   Review of Systems See HPI   Past Medical History:  Diagnosis Date  . Alcohol abuse    Sober for 7 years ( 2017).   . Chronic lower back pain    "hopefull not after this OR today" (03/18/2016)  . PONV (postoperative nausea and vomiting)    "just w/my wisdom teeth" (03/18/2016)    Social History   Socioeconomic History  . Marital status: Married    Spouse name: Not on file  . Number of children: Not on file  . Years of education: Not on file  . Highest education level: Not on file  Occupational History  . Not on file  Social Needs  . Financial resource strain: Not on file  . Food insecurity:    Worry: Not on file    Inability: Not on file  . Transportation needs:    Medical: Not on file    Non-medical: Not on file  Tobacco Use  . Smoking status: Former Smoker    Years: 15.00    Types: E-cigarettes, Cigarettes  . Smokeless tobacco: Never Used  . Tobacco comment: 03/18/2016 "I use an electronic cigarette"  Substance and Sexual Activity  . Alcohol use: No  . Drug use: No  . Sexual activity: Yes  Lifestyle  . Physical activity:    Days per week: Not on file    Minutes per session: Not on file  . Stress: Not on file  Relationships  . Social connections:   Talks on phone: Not on file    Gets together: Not on file    Attends religious service: Not on file    Active member of club or organization: Not on file    Attends meetings of clubs or organizations: Not on file    Relationship status: Not on file  . Intimate partner violence:    Fear of current or ex partner: Not on file    Emotionally abused: Not on file    Physically abused: Not on file    Forced sexual activity: Not on file  Other Topics Concern  . Not on file  Social History Narrative  . Not on file    Past Surgical History:  Procedure Laterality Date  . BACK SURGERY    . LUMBAR LAMINECTOMY/DECOMPRESSION MICRODISCECTOMY N/A 03/18/2016   Procedure: Right L5-S1 Microdiscectomy;  Surgeon: Eldred Manges, MD;  Location: [facility] OR;  Service: Orthopedics;  Laterality: N/A;  . MICRODISCECTOMY LUMBAR Right 03/18/2016   L5-S1  . WISDOM TOOTH EXTRACTION  1992    Family History  Problem Relation Age of Onset  . Alcohol abuse Father   . Hyperlipidemia Father   . Diabetes Father     Allergies  Allergen Reactions  . Citalopram Anxiety    Abdominal Pain     Current Outpatient Medications on File Prior to Visit  Medication Sig Dispense Refill  . ibuprofen (ADVIL,MOTRIN) 200 MG tablet Take 200 mg by mouth every 6 (six) hours as needed.    . pregabalin (LYRICA) 100 MG capsule Take one capsule twice daily. 60 capsule 3   No current facility-administered medications on file prior to visit.     BP 120/70   Temp 98.6 F (37 C) (Oral)   Wt 222 lb (100.7 kg)   BMI 27.02 kg/m       Objective:   Physical Exam  Constitutional: He is oriented to person, place, and time. He appears well-developed and well-nourished. No distress.  Cardiovascular: Normal rate, regular rhythm, normal heart sounds and intact distal pulses. Exam reveals no gallop and no friction rub.  No murmur heard. Pulmonary/Chest: Effort normal and breath sounds normal. No stridor. No respiratory distress. He has no  wheezes. He has no rales. He exhibits no tenderness.  Neurological: He is alert and oriented to person, place, and time. He displays normal reflexes. No cranial nerve deficit or sensory deficit. He exhibits normal muscle tone. Coordination normal.  Skin: Skin is warm. Capillary refill takes less than 2 seconds. He is not diaphoretic.  Psychiatric: He has a normal mood and affect. His behavior is normal. Judgment and thought content normal.  Nursing note and vitals reviewed.     Assessment & Plan:  1. Anxiety and depression - escitalopram (LEXAPRO) 5 MG tablet; Take 1 tablet (5 mg total) by mouth daily.  Dispense: 30 tablet; Refill: 2 - ALPRAZolam (XANAX) 0.25 MG tablet; Take 1 tablet (0.25 mg total) by mouth 2 (two) times daily as needed for anxiety.  Dispense: 30 tablet; Refill: 0 - Ambulatory referral to Psychiatry  Shirline Frees, NP

## 2018-05-10 ENCOUNTER — Encounter: Payer: Self-pay | Admitting: Adult Health

## 2018-05-10 ENCOUNTER — Telehealth: Payer: Self-pay | Admitting: Adult Health

## 2018-05-10 NOTE — Telephone Encounter (Signed)
Patient dropped off FMLA forms  Call patient for pick up at:[phone]  Disposition: Dr's [employer]

## 2018-05-10 NOTE — Telephone Encounter (Signed)
Pt has appt on 05/15/18

## 2018-05-10 NOTE — Telephone Encounter (Signed)
Pt called back to discuss the mychart response, call pt when possible

## 2018-05-10 NOTE — Telephone Encounter (Signed)
Pt called to check status of request in mychart message, call pt to advise

## 2018-05-11 ENCOUNTER — Encounter: Payer: Self-pay | Admitting: Adult Health

## 2018-05-15 ENCOUNTER — Ambulatory Visit: Payer: 59 | Admitting: Adult Health

## 2018-05-15 ENCOUNTER — Encounter: Payer: Self-pay | Admitting: Adult Health

## 2018-05-15 VITALS — BP 94/52 | Temp 98.4°F | Wt 219.0 lb

## 2018-05-15 DIAGNOSIS — F329 Major depressive disorder, single episode, unspecified: Secondary | ICD-10-CM

## 2018-05-15 DIAGNOSIS — F419 Anxiety disorder, unspecified: Secondary | ICD-10-CM

## 2018-05-15 MED ORDER — ESCITALOPRAM OXALATE 20 MG PO TABS: 20.0000 mg | ORAL_TABLET | Freq: Every day | ORAL | 1 refills | Status: DC

## 2018-05-15 MED ORDER — ALPRAZOLAM 0.5 MG PO TABS: 0.5000 mg | ORAL_TABLET | Freq: Two times a day (BID) | ORAL | 0 refills | Status: DC | PRN

## 2018-05-15 NOTE — Telephone Encounter (Signed)
Cory filled out while in OV.  Faxed to [employer].  Received confirmation the fax was successful.  Copy sent to scan.

## 2018-05-15 NOTE — Progress Notes (Signed)
Subjective:    Patient ID: David Salazar, male    DOB: NoAdelene Idler5 y.o.   MRN: [id number]  HPI  42 year old male who  has a past medical history of Alcohol abuse, Chronic lower back pain, and PONV (postoperative nausea and vomiting).  He presents to the office today for follow up regarding anxiety. During his last visit he was started Lexapro 5 mg. He reports that last week he was having severe panic attacks at work and went home. He has taken FMLA until June 3rd. He was advised to increase Lexapro from 5 mg to 10 mg. Since being home he reports that he has less anxiety but finds that the mornings have been the worse time for his anxiety. During the middle of the day he feels " completely fine".   Denies any suicidal ideation or depression. Has had a few bouts of diarrhea.    Review of Systems See HPI   Past Medical History:  Diagnosis Date  . Alcohol abuse    Sober for 7 years ( 2017).   . Chronic lower back pain    "hopefull not after this OR today" (03/18/2016)  . PONV (postoperative nausea and vomiting)    "just w/my wisdom teeth" (03/18/2016)    Social History   Socioeconomic History  . Marital status: Married    Spouse name: Not on file  . Number of children: Not on file  . Years of education: Not on file  . Highest education level: Not on file  Occupational History  . Not on file  Social Needs  . Financial resource strain: Not on file  . Food insecurity:    Worry: Not on file    Inability: Not on file  . Transportation needs:    Medical: Not on file    Non-medical: Not on file  Tobacco Use  . Smoking status: Former Smoker    Years: 15.00    Types: E-cigarettes, Cigarettes  . Smokeless tobacco: Never Used  . Tobacco comment: 03/18/2016 "I use an electronic cigarette"  Substance and Sexual Activity  . Alcohol use: No  . Drug use: No  . Sexual activity: Yes  Lifestyle  . Physical activity:    Days per week: Not on file    Minutes per session: Not  on file  . Stress: Not on file  Relationships  . Social connections:    Talks on phone: Not on file    Gets together: Not on file    Attends religious service: Not on file    Active member of club or organization: Not on file    Attends meetings of clubs or organizations: Not on file    Relationship status: Not on file  . Intimate partner violence:    Fear of current or ex partner: Not on file    Emotionally abused: Not on file    Physically abused: Not on file    Forced sexual activity: Not on file  Other Topics Concern  . Not on file  Social History Narrative  . Not on file    Past Surgical History:  Procedure Laterality Date  . BACK SURGERY    . LUMBAR LAMINECTOMY/DECOMPRESSION MICRODISCECTOMY N/A 03/18/2016   Procedure: Right L5-S1 Microdiscectomy;  Surgeon: Eldred Manges, MD;  Location: [facility] OR;  Service: Orthopedics;  Laterality: N/A;  . MICRODISCECTOMY LUMBAR Right 03/18/2016   L5-S1  . WISDOM TOOTH EXTRACTION  1992    Family History  Problem Relation Age of Onset  .  Alcohol abuse Father   . Hyperlipidemia Father   . Diabetes Father     Allergies  Allergen Reactions  . Citalopram Anxiety    Abdominal Pain     Current Outpatient Medications on File Prior to Visit  Medication Sig Dispense Refill  . ALPRAZolam (XANAX) 0.25 MG tablet Take 1 tablet (0.25 mg total) by mouth 2 (two) times daily as needed for anxiety. 30 tablet 0  . ibuprofen (ADVIL,MOTRIN) 200 MG tablet Take 200 mg by mouth every 6 (six) hours as needed.    . pregabalin (LYRICA) 100 MG capsule Take one capsule twice daily. 60 capsule 3   No current facility-administered medications on file prior to visit.     BP (!) 94/52   Temp 98.4 F (36.9 C) (Oral)   Wt 219 lb (99.3 kg)   BMI 26.66 kg/m       Objective:   Physical Exam  Constitutional: He is oriented to person, place, and time. He appears well-developed and well-nourished. No distress.  Cardiovascular: Normal rate, regular rhythm,  normal heart sounds and intact distal pulses. Exam reveals no gallop and no friction rub.  No murmur heard. Pulmonary/Chest: Effort normal and breath sounds normal.  Musculoskeletal: Normal range of motion.  Neurological: He is alert and oriented to person, place, and time.  Skin: He is not diaphoretic.  Psychiatric: He has a normal mood and affect. His behavior is normal. Judgment and thought content normal.  Vitals reviewed.     Assessment & Plan:  1. Anxiety - Will increase Lexapro form 5 mg to 20 mg. He will follow up with me towards the end of the week via mychart.  - escitalopram (LEXAPRO) 20 MG tablet; Take 1 tablet (20 mg total) by mouth daily.  Dispense: 30 tablet; Refill: 1 - ALPRAZolam (XANAX) 0.5 MG tablet; Take 1 tablet (0.5 mg total) by mouth 2 (two) times daily as needed for anxiety.  Dispense: 60 tablet; Refill: 0 - FMLA paperwork will be filled out this week   Shirline Frees, NP

## 2018-05-23 ENCOUNTER — Encounter: Payer: Self-pay | Admitting: Adult Health

## 2018-05-30 ENCOUNTER — Encounter: Payer: Self-pay | Admitting: Adult Health

## 2018-06-13 ENCOUNTER — Encounter: Payer: Self-pay | Admitting: Adult Health

## 2018-06-14 ENCOUNTER — Other Ambulatory Visit: Payer: Self-pay | Admitting: Adult Health

## 2018-06-14 DIAGNOSIS — F419 Anxiety disorder, unspecified: Principal | ICD-10-CM

## 2018-06-14 DIAGNOSIS — F329 Major depressive disorder, single episode, unspecified: Secondary | ICD-10-CM

## 2018-06-14 MED ORDER — ALPRAZOLAM 0.5 MG PO TABS: 0.5000 mg | ORAL_TABLET | Freq: Three times a day (TID) | ORAL | 0 refills | Status: DC | PRN

## 2018-07-10 ENCOUNTER — Encounter: Payer: Self-pay | Admitting: Adult Health

## 2018-07-10 ENCOUNTER — Other Ambulatory Visit: Payer: Self-pay | Admitting: Adult Health

## 2018-07-10 DIAGNOSIS — F329 Major depressive disorder, single episode, unspecified: Secondary | ICD-10-CM

## 2018-07-10 DIAGNOSIS — F32A Depression, unspecified: Secondary | ICD-10-CM

## 2018-07-10 DIAGNOSIS — F419 Anxiety disorder, unspecified: Secondary | ICD-10-CM

## 2018-07-10 MED ORDER — ESCITALOPRAM OXALATE 20 MG PO TABS: 20.0000 mg | ORAL_TABLET | Freq: Every day | ORAL | 1 refills | Status: DC

## 2018-07-10 MED ORDER — ALPRAZOLAM 0.5 MG PO TABS: 0.5000 mg | ORAL_TABLET | Freq: Three times a day (TID) | ORAL | 0 refills | Status: DC | PRN

## 2018-07-30 ENCOUNTER — Encounter: Payer: Self-pay | Admitting: Adult Health

## 2018-07-31 ENCOUNTER — Ambulatory Visit: Payer: 59 | Admitting: Adult Health

## 2018-07-31 ENCOUNTER — Encounter: Payer: Self-pay | Admitting: Adult Health

## 2018-07-31 VITALS — BP 100/66 | Temp 98.5°F | Wt 209.0 lb

## 2018-07-31 DIAGNOSIS — F410 Panic disorder [episodic paroxysmal anxiety] without agoraphobia: Secondary | ICD-10-CM

## 2018-07-31 MED ORDER — SERTRALINE HCL 50 MG PO TABS: 50.0000 mg | ORAL_TABLET | Freq: Every day | ORAL | 3 refills | Status: DC

## 2018-07-31 NOTE — Progress Notes (Signed)
Subjective:    Patient ID: David Salazar, male    DOB: 01-20-1976, 42 y.o.   MRN: [id number]  HPI  42 year old male who  has a past medical history of Alcohol abuse, Chronic lower back pain, and PONV (postoperative nausea and vomiting).  He presents to the office today for follow up regarding anxiety. He is currently prescribed Lexapro 20 mg daily and Xanax 0.5 mg TID PRN. He has his first appointment with Psychiatry in 3 weeks. Today he reports that his anxiety symptoms have become worse over the last few months and has been having more frequent panic attacks. His last attack was yesterday where he reports having a complete melt down and was unable to go to work    There was a short time frame where he thought his medications were working for him but this seems to have tapered off. He still continues to feel as though Xanax works to some effect.   Review of Systems See HPI   Past Medical History:  Diagnosis Date  . Alcohol abuse    Sober for 7 years ( 2017).   . Chronic lower back pain    "hopefull not after this OR today" (03/18/2016)  . PONV (postoperative nausea and vomiting)    "just w/my wisdom teeth" (03/18/2016)    Social History   Socioeconomic History  . Marital status: Married    Spouse name: Not on file  . Number of children: Not on file  . Years of education: Not on file  . Highest education level: Not on file  Occupational History  . Not on file  Social Needs  . Financial resource strain: Not on file  . Food insecurity:    Worry: Not on file    Inability: Not on file  . Transportation needs:    Medical: Not on file    Non-medical: Not on file  Tobacco Use  . Smoking status: Former Smoker    Years: 15.00    Types: E-cigarettes, Cigarettes  . Smokeless tobacco: Never Used  . Tobacco comment: 03/18/2016 "I use an electronic cigarette"  Substance and Sexual Activity  . Alcohol use: No  . Drug use: No  . Sexual activity: Yes  Lifestyle  . Physical  activity:    Days per week: Not on file    Minutes per session: Not on file  . Stress: Not on file  Relationships  . Social connections:    Talks on phone: Not on file    Gets together: Not on file    Attends religious service: Not on file    Active member of club or organization: Not on file    Attends meetings of clubs or organizations: Not on file    Relationship status: Not on file  . Intimate partner violence:    Fear of current or ex partner: Not on file    Emotionally abused: Not on file    Physically abused: Not on file    Forced sexual activity: Not on file  Other Topics Concern  . Not on file  Social History Narrative  . Not on file    Past Surgical History:  Procedure Laterality Date  . BACK SURGERY    . LUMBAR LAMINECTOMY/DECOMPRESSION MICRODISCECTOMY N/A 03/18/2016   Procedure: Right L5-S1 Microdiscectomy;  Surgeon: Eldred MangesMark C Yates, MD;  [facility];  Service: Orthopedics;  Laterality: N/A;  . MICRODISCECTOMY LUMBAR Right 03/18/2016   L5-S1  . WISDOM TOOTH EXTRACTION  1992  Family History  Problem Relation Age of Onset  . Alcohol abuse Father   . Hyperlipidemia Father   . Diabetes Father     Allergies  Allergen Reactions  . Citalopram Anxiety    Abdominal Pain     Current Outpatient Medications on File Prior to Visit  Medication Sig Dispense Refill  . ALPRAZolam (XANAX) 0.5 MG tablet Take 1 tablet (0.5 mg total) by mouth 3 (three) times daily as needed for anxiety. 90 tablet 0  . escitalopram (LEXAPRO) 20 MG tablet Take 1 tablet (20 mg total) by mouth daily. 90 tablet 1  . ibuprofen (ADVIL,MOTRIN) 200 MG tablet Take 200 mg by mouth every 6 (six) hours as needed.    . pregabalin (LYRICA) 100 MG capsule Take one capsule twice daily. 60 capsule 3   No current facility-administered medications on file prior to visit.     BP 100/66   Temp 98.5 F (36.9 C) (Oral)   Wt 209 lb (94.8 kg)   BMI 25.44 kg/m       Objective:   Physical Exam    Constitutional: He is oriented to person, place, and time. He appears well-developed and well-nourished. No distress.  Cardiovascular: Normal rate and regular rhythm.  Pulmonary/Chest: Effort normal and breath sounds normal.  Musculoskeletal: Normal range of motion.  Neurological: He is alert and oriented to person, place, and time.  Skin: Skin is warm and dry. Capillary refill takes less than 2 seconds. He is not diaphoretic.  Psychiatric: His speech is normal and behavior is normal. Judgment and thought content normal. His mood appears anxious. Cognition and memory are normal.  Nursing note and vitals reviewed.     Assessment & Plan:  1. Anxiety attack - Will switch from Lexapro to Zoloft. I am going to hold off on increasing Xanax at this time. Advised follow up with mychart.  - sertraline (ZOLOFT) 50 MG tablet; Take 1 tablet (50 mg total) by mouth daily.  Dispense: 30 tablet; Refill: 3 - Return precautions reviewed   Shirline Frees, NP

## 2018-08-06 ENCOUNTER — Encounter: Payer: Self-pay | Admitting: Adult Health

## 2018-08-07 ENCOUNTER — Other Ambulatory Visit: Payer: Self-pay | Admitting: Adult Health

## 2018-08-07 DIAGNOSIS — F329 Major depressive disorder, single episode, unspecified: Secondary | ICD-10-CM

## 2018-08-07 DIAGNOSIS — F419 Anxiety disorder, unspecified: Principal | ICD-10-CM

## 2018-08-07 MED ORDER — ALPRAZOLAM 1 MG PO TABS: 1.0000 mg | ORAL_TABLET | Freq: Two times a day (BID) | ORAL | 1 refills | Status: AC | PRN

## 2018-08-15 ENCOUNTER — Other Ambulatory Visit: Payer: Self-pay | Admitting: Adult Health

## 2018-08-15 ENCOUNTER — Encounter: Payer: Self-pay | Admitting: Adult Health

## 2018-08-15 DIAGNOSIS — F410 Panic disorder [episodic paroxysmal anxiety] without agoraphobia: Secondary | ICD-10-CM

## 2018-08-15 MED ORDER — SERTRALINE HCL 100 MG PO TABS
100.0000 mg | ORAL_TABLET | Freq: Every day | ORAL | 0 refills | Status: DC
Start: 2018-08-15 — End: 2020-05-20

## 2018-08-17 ENCOUNTER — Ambulatory Visit (HOSPITAL_COMMUNITY): Payer: 59 | Admitting: Psychiatry

## 2018-08-28 ENCOUNTER — Encounter (INDEPENDENT_AMBULATORY_CARE_PROVIDER_SITE_OTHER): Payer: Self-pay | Admitting: Orthopaedic Surgery

## 2018-08-29 ENCOUNTER — Encounter (INDEPENDENT_AMBULATORY_CARE_PROVIDER_SITE_OTHER): Payer: Self-pay | Admitting: Orthopaedic Surgery

## 2018-08-29 ENCOUNTER — Ambulatory Visit (INDEPENDENT_AMBULATORY_CARE_PROVIDER_SITE_OTHER): Payer: 59 | Admitting: Orthopaedic Surgery

## 2018-08-29 VITALS — BP 118/77 | HR 72 | Ht 76.0 in | Wt 209.0 lb

## 2018-08-29 DIAGNOSIS — M4722 Other spondylosis with radiculopathy, cervical region: Secondary | ICD-10-CM | POA: Insufficient documentation

## 2018-08-29 DIAGNOSIS — M4802 Spinal stenosis, cervical region: Secondary | ICD-10-CM

## 2018-08-29 MED ORDER — TRAMADOL HCL 50 MG PO TABS: 50.0000 mg | ORAL_TABLET | Freq: Every day | ORAL | 0 refills | Status: DC | PRN

## 2018-08-29 NOTE — Progress Notes (Signed)
Office Visit Note   Patient: David Salazar           Date of Birth: 1976-11-09           MRN: [id number] Visit Date: 08/29/2018              Requested by: Shirline Frees, NP [address] [place], [place] [address] PCP: Shirline Frees, NP   Assessment & Plan: Visit Diagnoses:  1. Other spondylosis with radiculopathy, cervical region   2. Spinal stenosis of cervical region     Plan: We will set patient up for some outpatient therapy which is close to his home.  I recommend he try the prednisone just 1 tablet daily.  I will recheck him in 4 to 5 weeks if he is having persistent problems we discussed single level cervical fusion for single level disc degeneration with severe by foraminal stenosis and moderate central stenosis.  MRI scan was reviewed I gave him copy of the report.  He is used anti-inflammatories and Tylenol without relief.  He requested a few tramadol tablets to use on at night only on the nights he is unable to sleep so that he can continue to work.  Recheck 5 weeks.  Follow-Up Instructions: No follow-ups on file.   Orders:  No orders of the defined types were placed in this encounter.  No orders of the defined types were placed in this encounter.     Procedures: No procedures performed   Clinical Data: No additional findings.   Subjective: Chief Complaint  Patient presents with  . Neck - Pain    HPI 42 year old male returns with cervical spondylosis present for greater than a year with persistent neck pain right radial hand numbness pain with activities of daily living difficulty falling asleep due to neck pain and recurrent weakness in his right arm.  He has had MRI scan done 02/11/2018 which showed single level C5-6 cervical spondylosis by foraminal stenosis and moderate central stenosis without cord abnormal signal.  He is done some stretching exercises without relief.  He had a prednisone taper and after 2 days he had severe problems with  increased anxiety difficulty sleeping, felt jittery and stopped the medication.  He still has some remaining tablets.  Review of Systems previous right lumbar L5-S1 microdiscectomy 03/18/2016.  Positive for anxiety, hyperlipidemia, hypertension, past history of alcohol abuse.   Objective: Vital Signs: BP 118/77   Pulse 72   Ht 6\' 4"  (1.93 m)   Wt 209 lb (94.8 kg)   BMI 25.44 kg/m   Physical Exam  Constitutional: He is oriented to person, place, and time. He appears well-developed and well-nourished.  HENT:  Head: Normocephalic and atraumatic.  Eyes: Pupils are equal, round, and reactive to light. EOM are normal.  Neck: No tracheal deviation present. No thyromegaly present.  Cardiovascular: Normal rate.  Pulmonary/Chest: Effort normal. He has no wheezes.  Abdominal: Soft. Bowel sounds are normal.  Neurological: He is alert and oriented to person, place, and time.  Skin: Skin is warm and dry. Capillary refill takes less than 2 seconds.  Psychiatric: He has a normal mood and affect. His behavior is normal. Judgment and thought content normal.    Ortho Exam negative straight leg raising right and left normal heel toe gait.  He has severe right brachial plexus tenderness moderate left.  Positive Spurling on the right mild on the left.  Mild wrist extension weakness on the right normal on the left.  No decreased grip  interossei are strong deltoid is normal no shoulder impingement.  Sensation is hand shows decreased sensation radial side of his hand.  Ulnar 2 fingers have normal sensation.  Ulnar nerve at the elbow median nerve at the wrist and the forearm is normal.  Specialty Comments:  No specialty comments available.  Imaging:CLINICAL DATA:  Pain in the neck, right shoulder, and right arm. Right upper extremity weakness. Numbness in the first through third digits of the right hand.  EXAM: MRI CERVICAL SPINE WITHOUT AND WITH CONTRAST  TECHNIQUE: Multiplanar and multiecho pulse  sequences of the cervical spine, to include the craniocervical junction and cervicothoracic junction, were obtained without and with intravenous contrast.  CONTRAST:  47mL MULTIHANCE GADOBENATE DIMEGLUMINE 529 MG/ML IV SOLN  COMPARISON:  None.  FINDINGS: Alignment: Cervical spine straightening. Trace retrolisthesis of C5 on C6.  Vertebrae: No fracture. Mild-to-moderate degenerative endplate edema and enhancement at C5-6. 1 cm enhancing lesion in the C4 vertebral body with faint intrinsic T1 signal favored to represent a hemangioma. Mild diffuse narrowing of the cervical spinal canal on a congenital basis.  Cord: Normal signal.  No abnormal intradural enhancement.  Posterior Fossa, vertebral arteries, paraspinal tissues: Unremarkable.  Disc levels:  C2-3: Negative.  C3-4: Mild disc bulging without stenosis.  C4-5: Negative.  C5-6: Moderate to severe disc space narrowing. Broad-based posterior disc osteophyte complex and infolding of the ligamentum flavum result in moderate spinal stenosis (7 mm AP spinal canal diameter) with mild cord flattening and severe bilateral neural foraminal stenosis. Potential bilateral C6 nerve root impingement.  C7-T1: Negative.  C7-T1: Small central/left central disc protrusion results in mild spinal stenosis without significant spinal cord mass effect. No neural foraminal stenosis.  IMPRESSION: 1. Focally advanced disc degeneration at C5-6 resulting in moderate spinal and severe bilateral neural foraminal stenosis. 2. Mild spinal stenosis at C7-T1 due to a central disc protrusion.   Electronically Signed   By: Sebastian Ache M.D.   On: 02/11/2018 14:00    PMFS History: Patient Active Problem List   Diagnosis Date Noted  . Anxiety and depression 08/10/2017  . S/P lumbar discectomy 03/18/2016  . Hyperlipidemia 10/27/2009  . ALCOHOL ABUSE 10/27/2009  . HYPERTENSION 10/27/2009   Past Medical History:  Diagnosis  Date  . Alcohol abuse    Sober for 7 years ( 2017).   . Chronic lower back pain    "hopefull not after this OR today" (03/18/2016)  . PONV (postoperative nausea and vomiting)    "just w/my wisdom teeth" (03/18/2016)    Family History  Problem Relation Age of Onset  . Alcohol abuse Father   . Hyperlipidemia Father   . Diabetes Father     Past Surgical History:  Procedure Laterality Date  . BACK SURGERY    . LUMBAR LAMINECTOMY/DECOMPRESSION MICRODISCECTOMY N/A 03/18/2016   Procedure: Right L5-S1 Microdiscectomy;  Surgeon: Eldred Manges, MD;  Location: [facility] OR;  Service: Orthopedics;  Laterality: N/A;  . MICRODISCECTOMY LUMBAR Right 03/18/2016   L5-S1  . WISDOM TOOTH EXTRACTION  1992   Social History   Occupational History  . Not on file  Tobacco Use  . Smoking status: Former Smoker    Years: 15.00    Types: E-cigarettes, Cigarettes  . Smokeless tobacco: Never Used  . Tobacco comment: 03/18/2016 "I use an electronic cigarette"  Substance and Sexual Activity  . Alcohol use: No  . Drug use: No  . Sexual activity: Yes

## 2018-08-31 NOTE — Addendum Note (Signed)
Addended by: Rogers Seeds on: 08/31/2018 11:09 AM   Modules accepted: Orders

## 2018-09-02 IMAGING — MR MR CERVICAL SPINE WO/W CM
5 of 8 series · 27 of 48 positions shown · IV contrast (multihance)
Comparison: None.

CLINICAL DATA: Pain in the neck, right shoulder, and right arm.
Right upper extremity weakness. Numbness in the first through third
digits of the right hand.

EXAM:
MRI CERVICAL SPINE WITHOUT AND WITH CONTRAST
TECHNIQUE: Multiplanar and multiecho pulse sequences of the cervical spine, to
include the craniocervical junction and cervicothoracic junction,
were obtained without and with intravenous contrast.
CONTRAST:  19mL MULTIHANCE GADOBENATE DIMEGLUMINE 529 MG/ML IV SOLN

[Series 5: T1 · sagittal · 3.0mm · 0.66mm/px · 4 of 15 slices shown (1 of 3)]
[im 1/15]
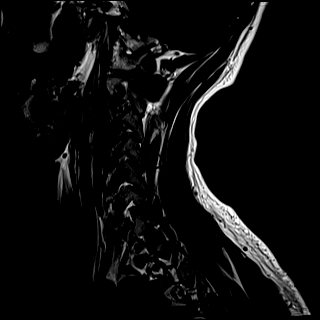
[im 5/15]
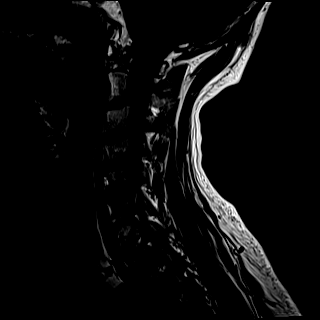
[im 10/15]
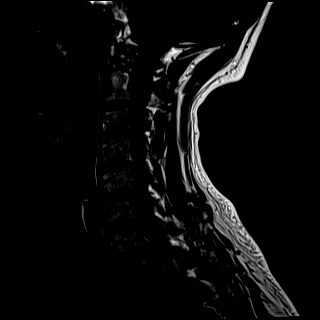
[im 15/15]
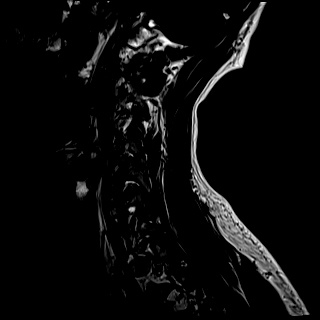

[Series 11: T2 · axial · 3.5mm · 0.50mm/px · z∈[-52,+65]mm · 8 of 32 slices shown (1 of 2)]
[im 1/32]
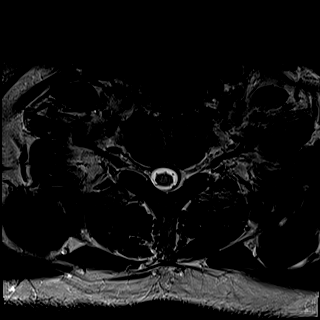
[im 5/32]
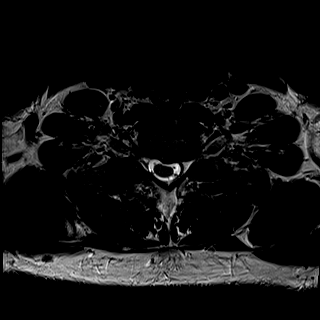
[im 9/32]
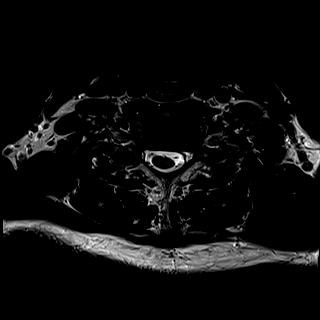
[im 14/32]
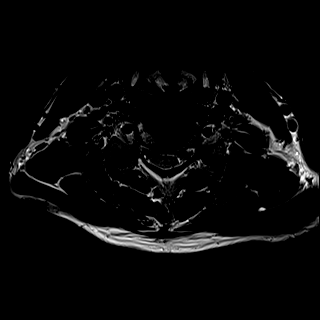
[im 18/32]
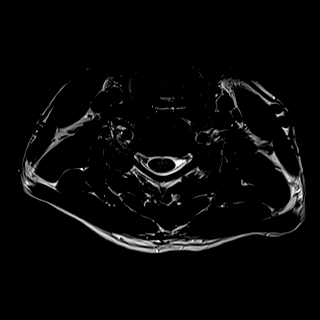
[im 23/32]
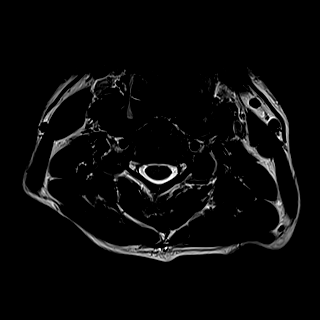
[im 27/32]
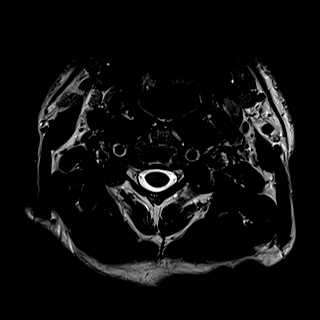
[im 32/32]
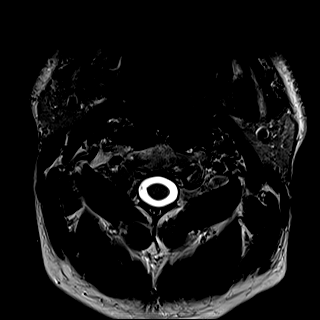

[Series 17: T1 · axial · non-contrast · 3.5mm · 0.31mm/px · z∈[-55,+60]mm · 8 of 32 slices shown (2 of 3)]
[im 1/32]
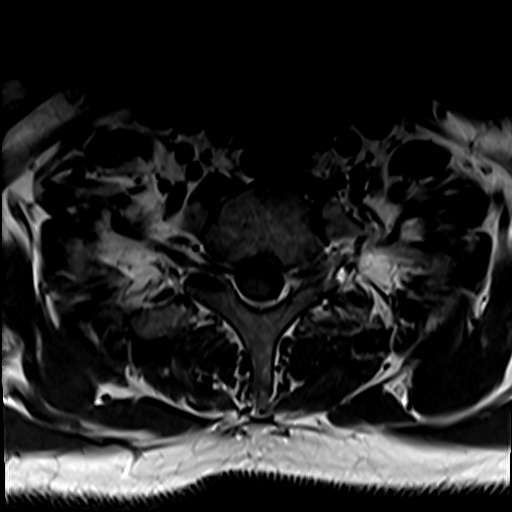
[im 5/32]
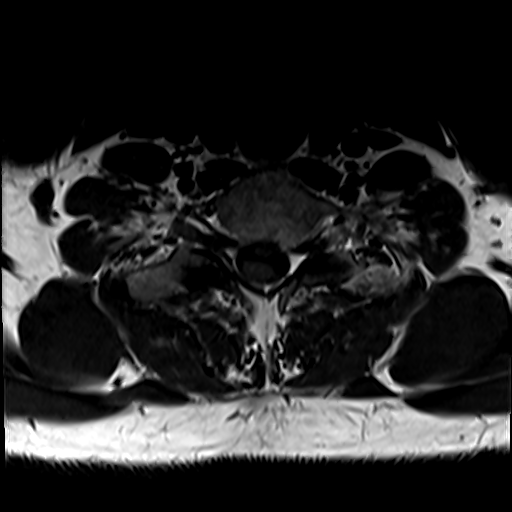
[im 9/32]
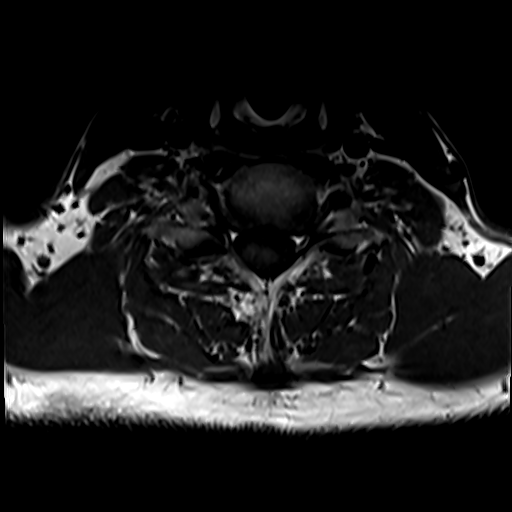
[im 14/32]
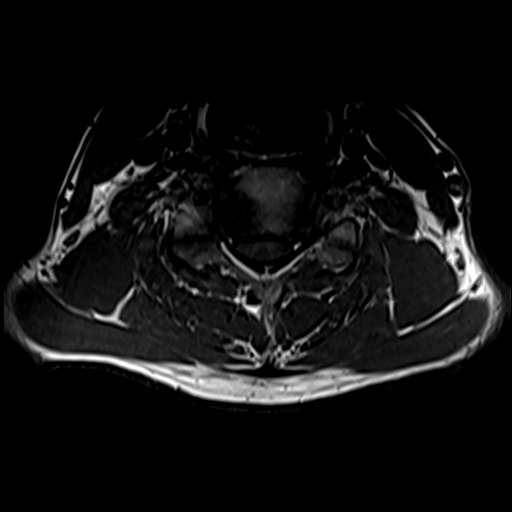
[im 18/32]
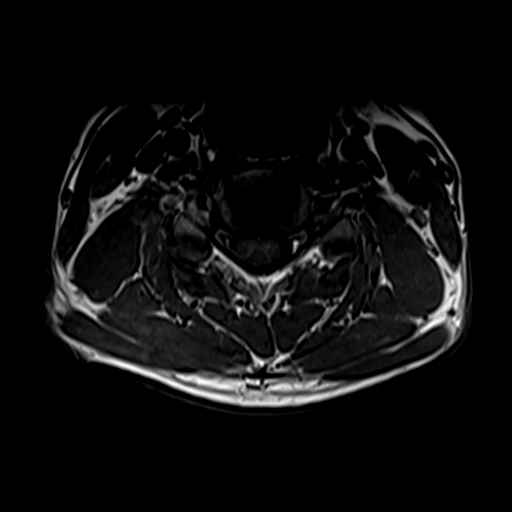
[im 23/32]
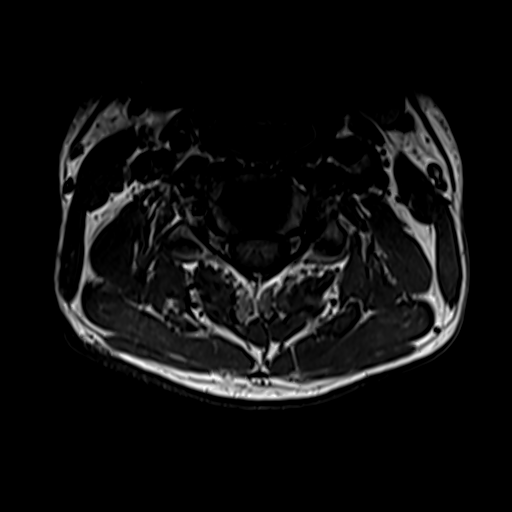
[im 27/32]
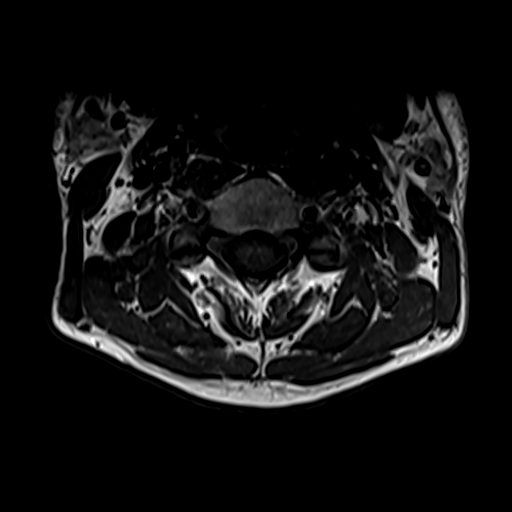
[im 32/32]
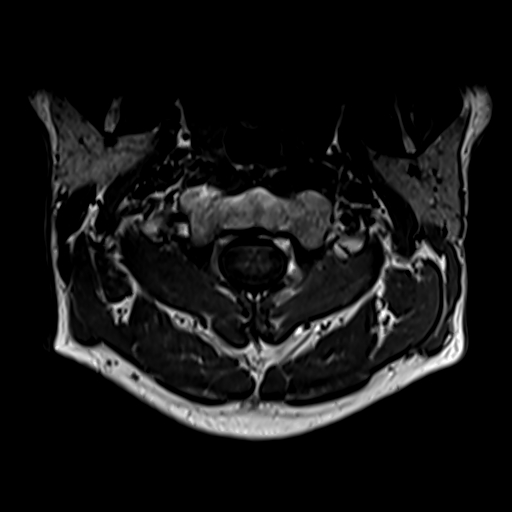

[Series 18: T2 · sagittal · 3.0mm · 0.55mm/px · 4 of 15 slices shown (2 of 2)]
[im 1/15]
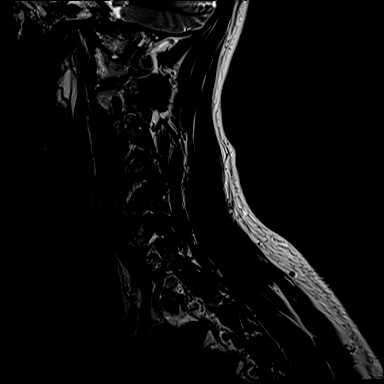
[im 5/15]
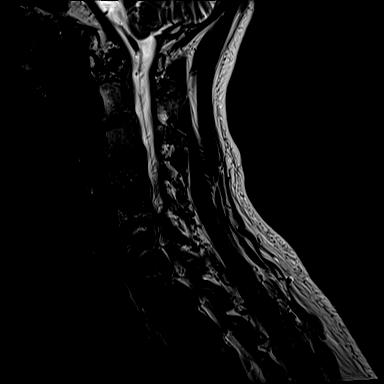
[im 10/15]
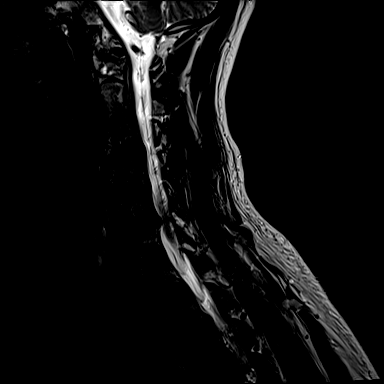
[im 15/15]
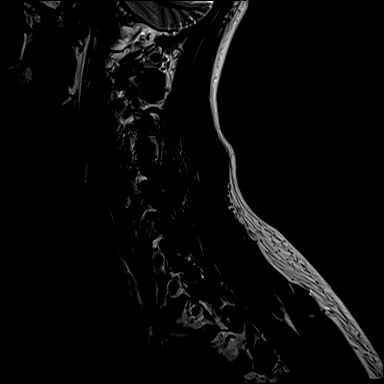

[Series 20: T1 · axial · 3.5mm · 0.31mm/px · z∈[-55,-25]mm · 3 of 32 slices shown (3 of 3)]
[im 1/32]
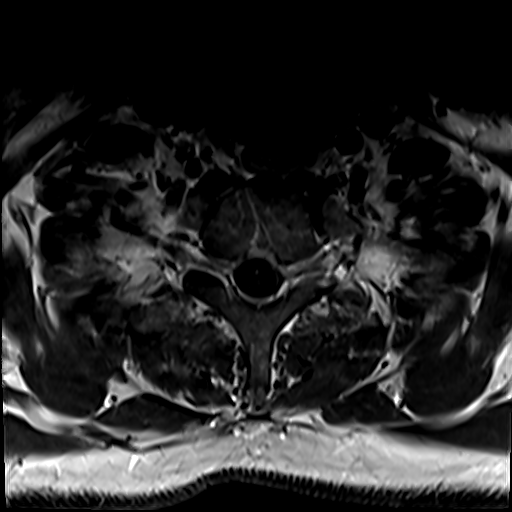
[im 5/32]
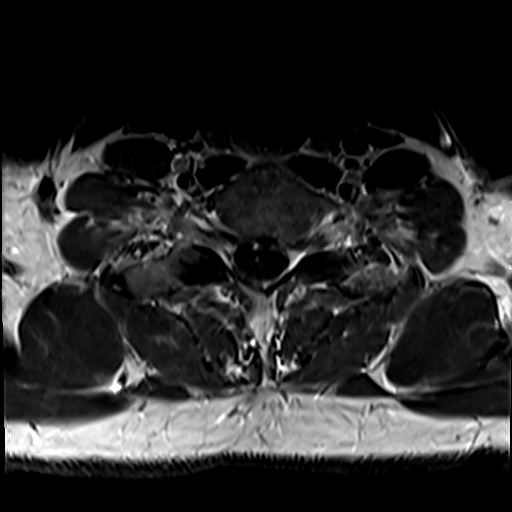
[im 9/32]
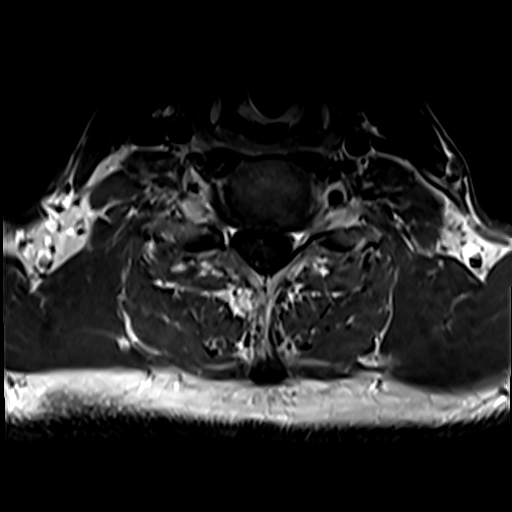

[27 of 48 positions shown; findings below may reference images not displayed]

FINDINGS: Alignment: Cervical spine straightening. Trace retrolisthesis of C5
on C6.

Vertebrae: No fracture. Mild-to-moderate degenerative endplate edema
and enhancement at C5-6. 1 cm enhancing lesion in the C4 vertebral
body with faint intrinsic T1 signal favored to represent a
hemangioma. Mild diffuse narrowing of the cervical spinal canal on a
congenital basis.

Cord: Normal signal.  No abnormal intradural enhancement.

Posterior Fossa, vertebral arteries, paraspinal tissues:
Unremarkable.

Disc levels:

C2-3: Negative.

C3-4: Mild disc bulging without stenosis.

C4-5: Negative.

C5-6: Moderate to severe disc space narrowing. Broad-based posterior
disc osteophyte complex and infolding of the ligamentum flavum
result in moderate spinal stenosis (7 mm AP spinal canal diameter)
with mild cord flattening and severe bilateral neural foraminal
stenosis. Potential bilateral C6 nerve root impingement.

C7-T1: Negative.

C7-T1: Small central/left central disc protrusion results in mild
spinal stenosis without significant spinal cord mass effect. No
neural foraminal stenosis.
IMPRESSION: 1. Focally advanced disc degeneration at C5-6 resulting in moderate
spinal and severe bilateral neural foraminal stenosis.
2. Mild spinal stenosis at C7-T1 due to a central disc protrusion.

## 2018-09-10 ENCOUNTER — Ambulatory Visit (INDEPENDENT_AMBULATORY_CARE_PROVIDER_SITE_OTHER): Payer: 59 | Admitting: Rehabilitative and Restorative Service Providers"

## 2018-09-10 ENCOUNTER — Encounter: Payer: Self-pay | Admitting: Rehabilitative and Restorative Service Providers"

## 2018-09-10 DIAGNOSIS — R29898 Other symptoms and signs involving the musculoskeletal system: Secondary | ICD-10-CM

## 2018-09-10 DIAGNOSIS — M542 Cervicalgia: Secondary | ICD-10-CM | POA: Diagnosis not present

## 2018-09-10 DIAGNOSIS — R293 Abnormal posture: Secondary | ICD-10-CM | POA: Diagnosis not present

## 2018-09-10 NOTE — Patient Instructions (Signed)
Axial Extension (Chin Tuck)    Pull chin in and lengthen back of neck. Hold __5__ seconds while counting out loud. Repeat __10__ times. Do __several__ sessions per day.  Shoulder Blade Squeeze    Rotate shoulders back, then squeeze shoulder blades down and back. Hold 10 sec Repeat _10___ times. Do _several ___ sessions per day.  Upper Back Strength: Lower Trapezius / Rotator Cuff " L's "     Arms in waitress pose, palms up. Press hands back and slide shoulder blades down. Hold for __5__ seconds. Repeat _10___ times. 1-2 times per day.    Scapular Retraction: Elbow Flexion (Standing)  "W's"     With elbows bent to 90, pinch shoulder blades together and rotate arms out, keeping elbows bent. Repeat __10__ times per set. Do __1-2__ sets per session. Do _several ___ sessions per day.  Scapula Adduction With Pectoralis Stretch: Low - Standing   Shoulders at 45 hands even with shoulders, keeping weight through legs, shift weight forward until you feel pull or stretch through the front of your chest. Hold _30__ seconds. Do _3__ times, _2-4__ times per day.   Scapula Adduction With Pectoralis Stretch: Mid-Range - Standing   Shoulders at 90 elbows even with shoulders, keeping weight through legs, shift weight forward until you feel pull or strength through the front of your chest. Hold __30_ seconds. Do _3__ times, __2-4_ times per day.   Scapula Adduction With Pectoralis Stretch: High - Standing   Shoulders at 120 hands up high on the doorway, keeping weight on feet, shift weight forward until you feel pull or stretch through the front of your chest. Hold _30__ seconds. Do _3__ times, _2-3__ times per day.   Trigger Point Dry Needling  . What is Trigger Point Dry Needling (DN)? o DN is a physical therapy technique used to treat muscle pain and dysfunction. Specifically, DN helps deactivate muscle trigger points (muscle knots).  o A thin filiform needle is used to  penetrate the skin and stimulate the underlying trigger point. The goal is for a local twitch response (LTR) to occur and for the trigger point to relax. No medication of any kind is injected during the procedure.   . What Does Trigger Point Dry Needling Feel Like?  o The procedure feels different for each individual patient. Some patients report that they do not actually feel the needle enter the skin and overall the process is not painful. Very mild bleeding may occur. However, many patients feel a deep cramping in the muscle in which the needle was inserted. This is the local twitch response.   Marland Kitchen How Will I feel after the treatment? o Soreness is normal, and the onset of soreness may not occur for a few hours. Typically this soreness does not last longer than two days.  o Bruising is uncommon, however; ice can be used to decrease any possible bruising.  o In rare cases feeling tired or nauseous after the treatment is normal. In addition, your symptoms may get worse before they get better, this period will typically not last longer than 24 hours.   . What Can I do After My Treatment? o Increase your hydration by drinking more water for the next 24 hours. o You may place ice or heat on the areas treated that have become sore, however, do not use heat on inflamed or bruised areas. Heat often brings more relief post needling. o You can continue your regular activities, but vigorous activity is not recommended initially  after the treatment for 24 hours. o DN is best combined with other physical therapy such as strengthening, stretching, and other therapies.     [facility] [address] [place], [place] [address]  [phone] (office) [phone] (fax)

## 2018-09-10 NOTE — Therapy (Signed)
[facility] [place] [address] [place], [place], [address] Phone: [phone]   Fax:  [phone]  Physical Therapy Evaluation  Patient Details  Name: David Salazar MRN: [id number] Date of Birth: 04-May-1976 Referring Provider: Dr Annell Greening    Encounter Date: 09/10/2018  PT End of Session - 09/10/18 0750    Visit Number  1    Number of Visits  12    Date for PT Re-Evaluation  10/22/18    PT Start Time  0715    PT Stop Time  0800    PT Time Calculation (min)  45 min    Activity Tolerance  Patient tolerated treatment well       Past Medical History:  Diagnosis Date  . Alcohol abuse    Sober for 7 years ( 2017).   . Chronic lower back pain    "hopefull not after this OR today" (03/18/2016)  . PONV (postoperative nausea and vomiting)    "just w/my wisdom teeth" (03/18/2016)    Past Surgical History:  Procedure Laterality Date  . BACK SURGERY    . LUMBAR LAMINECTOMY/DECOMPRESSION MICRODISCECTOMY N/A 03/18/2016   Procedure: Right L5-S1 Microdiscectomy;  Surgeon: Eldred Manges, MD;  Location: [facility] OR;  Service: Orthopedics;  Laterality: N/A;  . MICRODISCECTOMY LUMBAR Right 03/18/2016   L5-S1  . WISDOM TOOTH EXTRACTION  1992    There were no vitals filed for this visit.   Subjective Assessment - 09/10/18 0718    Subjective  Patient reports onset of muscle spasms in the Rt shoulder area and numbness into the Rt middle finger. He also noted some weakness in the Rt UE. Symptoms improved then began again the first of July. symtpoms have progressively worsened. MRI     Pertinent History  lumbar microdiscetomy 03/18/16 with improvement in LBP and LE symptoms; Depression and anxiety     Diagnostic tests  MRI - DDD C5/6 stenosis at this level as well as C7/T1     Patient Stated Goals  improve symptoms and avoid surgery     Currently in Pain?  Yes    Pain Score  2     Pain Location  Neck    Pain Orientation  Right;Lower     Pain Descriptors / Indicators  Tingling;Nagging;Numbness    Pain Type  Chronic pain    Pain Radiating Towards  into the shoulder posteriorly and numness into the forearm and hand long finger and index fingers     Pain Onset  More than a month ago    Pain Frequency  Constant   numbness intermittent    Aggravating Factors   sitting for prolonged periods of time, especially with arms up resting on table; looking up; reaching; lifting overhead    Pain Relieving Factors  OTC meds; hot shower          [facility] PT Assessment - 09/10/18 0001      Assessment   Medical Diagnosis  Cervical radiculopathy     Referring Provider  Dr Annell Greening     Onset Date/Surgical Date  01/26/18   flare up of symptoms 06/25/18   Hand Dominance  Left    Next MD Visit  10/05/18    Prior Therapy  none for neck       Precautions   Precautions  None      Balance Screen   Has the patient fallen in the past 6 months  No    Has the patient had a  decrease in activity level because of a fear of falling?   No    Is the patient reluctant to leave their home because of a fear of falling?   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; Theatre stage manager       Observation/Other Assessments   Focus on Therapeutic Outcomes (FOTO)   59% limitation       Sensation   Additional Comments  numbness and tingling Rt forearm and dorsal hand into the long and index fingers - intermittnly       Posture/Postural Control   Posture Comments  sits slumped into chair shoulders forward; back rounded;       AROM   Overall AROM Comments  limited shoulder ROM Rt due to painin the Rt neck into the shoulder     Cervical Flexion  44 deg tightness     Cervical Extension  31 deg pain in the Rt neck and  shoulder     Cervical - Right Side Bend  14 pain Rt neck and shoulder     Cervical - Left Side Bend  26 tightness on the Rt     Cervical - Right Rotation  61 some discomfort Rt neck and shoulder     Cervical - Left Rotation  70       Strength   Overall Strength Comments  grossly WFL's bilat UE's       Palpation   Spinal mobility  hypomobile cervical and thoracic spine with CPA mobs     Palpation comment  significnat muscular tightness Rt > Lt ant/lat/post cervical musculature; upper traps; leveator; pecs                 Objective measurements completed on examination: See above findings.              PT Education - 09/10/18 0750    Education Details  HEP postural correction; DN     Person(s) Educated  Patient    Methods  Explanation;Demonstration;Tactile cues;Verbal cues;Handout    Comprehension  Verbalized understanding;Returned demonstration;Verbal cues required;Tactile cues required          PT Long Term Goals - 09/10/18 1245      PT LONG TERM GOAL #1   Title  Improve posture and alignment with patient to demonstrate improved upright posture with posterior shoulder girdle engaged 10/19/18    Time  6    Period  Weeks    Status  New      PT LONG TERM GOAL #2   Title  Decrease pain and Rt UE radicular symptoms by 50-76% per pt report 10/19/18    Time  6    Period  Weeks    Status  New      PT LONG TERM GOAL #3   Title  Increase cervical ROM by 5-10 deg 10/19/18    Time  6    Period  Weeks    Status  New      PT LONG TERM GOAL #4   Title  Independent in HEP 10/19/18    Time  6    Period  Weeks    Status  New  PT LONG TERM GOAL #5   Title  Improve FOTO 43% limitation indicating improved function with less pain 10/19/18    Time  6    Period  Weeks    Status  New             Plan - 09/10/18 0752    Clinical Impression Statement  Criag presents with chronic cervical dysfunction with symtpoms increasing in the past 2 months. He  has muscular tightness and pain in the Rt cervical area as well as into the Rt shoulder girdle. Patient reports radiating symptoms into the Rt forearm and dorsal hand including numbness and tingling. He prosents with poor posture and alignment; limited cervical and shoulder ROM; muscular tightness to palpation through the Rt upper quarter; muscular imbalance; pain. Patient will bnefit from PT to address problems identified.     History and Personal Factors relevant to plan of care:  history of lumbar microdiscectomy 2017    Clinical Presentation  Evolving    Clinical Decision Making  Low    Rehab Potential  Good    Clinical Impairments Affecting Rehab Potential  poor posture and alignment; prolonged postures and positions at work     PT Frequency  2x / week    PT Duration  6 weeks    PT Treatment/Interventions  Patient/family education;ADLs/Self Care Home Management;Cryotherapy;Electrical Stimulation;Iontophoresis 4mg /ml Dexamethasone;Moist Heat;Ultrasound;Dry needling;Manual techniques;Neuromuscular re-education;Functional mobility training;Therapeutic activities;Therapeutic exercise    PT Next Visit Plan  review HEP; trial of DN vs manual work ant/lat/post cervical and upper trap musculature; continue postural correction; modalities as indicated     Consulted and Agree with Plan of Care  Patient       Patient will benefit from skilled therapeutic intervention in order to improve the following deficits and impairments:  Postural dysfunction, Improper body mechanics, Pain, Increased fascial restricitons, Increased muscle spasms, Decreased mobility, Decreased range of motion, Decreased activity tolerance  Visit Diagnosis: Cervicalgia - Plan: PT plan of care cert/re-cert  Abnormal posture - Plan: PT plan of care cert/re-cert  Other symptoms and signs involving the musculoskeletal system - Plan: PT plan of care cert/re-cert     Problem List Patient Active Problem List   Diagnosis Date Noted   . Other spondylosis with radiculopathy, cervical region 08/29/2018  . Spinal stenosis of cervical region 08/29/2018  . Anxiety and depression 08/10/2017  . S/P lumbar discectomy 03/18/2016  . Hyperlipidemia 10/27/2009  . ALCOHOL ABUSE 10/27/2009  . HYPERTENSION 10/27/2009    Demani Weyrauch Rober MinionP Jennett Tarbell PT, MPH  09/10/2018, 12:52 PM  [facility] [address] [place], [place], [address] Phone: [phone]   Fax:  [phone]  Name: Adelene IdlerCraig Matthew Mcpartlin MRN: [id number] Date of Birth: 07-12-1976

## 2018-09-11 ENCOUNTER — Encounter (INDEPENDENT_AMBULATORY_CARE_PROVIDER_SITE_OTHER): Payer: Self-pay | Admitting: Orthopaedic Surgery

## 2018-09-13 ENCOUNTER — Ambulatory Visit (INDEPENDENT_AMBULATORY_CARE_PROVIDER_SITE_OTHER): Payer: 59 | Admitting: Physical Therapy

## 2018-09-13 DIAGNOSIS — R293 Abnormal posture: Secondary | ICD-10-CM

## 2018-09-13 DIAGNOSIS — R29898 Other symptoms and signs involving the musculoskeletal system: Secondary | ICD-10-CM | POA: Diagnosis not present

## 2018-09-13 DIAGNOSIS — M542 Cervicalgia: Secondary | ICD-10-CM

## 2018-09-13 NOTE — Therapy (Signed)
[facility] [place] [address] [place], [place], [address] Phone: [phone]   Fax:  [phone]  Physical Therapy Treatment  Patient Details  Name: David Salazar MRN: [id number] Date of Birth: [birth year] Referring Provider: Dr Annell Greening    Encounter Date: 09/13/2018  PT End of Session - 09/13/18 0804    Visit Number  2    Number of Visits  12    Date for PT Re-Evaluation  10/22/18    PT Start Time  0802    PT Stop Time  0900    PT Time Calculation (min)  58 min       Past Medical History:  Diagnosis Date  . Alcohol abuse    Sober for 7 years ( 2017).   . Chronic lower back pain    "hopefull not after this OR today" (03/18/2016)  . PONV (postoperative nausea and vomiting)    "just w/my wisdom teeth" (03/18/2016)    Past Surgical History:  Procedure Laterality Date  . BACK SURGERY    . LUMBAR LAMINECTOMY/DECOMPRESSION MICRODISCECTOMY N/A 03/18/2016   Procedure: Right L5-S1 Microdiscectomy;  Surgeon: Eldred Manges, MD;  Location: [facility] OR;  Service: Orthopedics;  Laterality: N/A;  . MICRODISCECTOMY LUMBAR Right 03/18/2016   L5-S1  . WISDOM TOOTH EXTRACTION  1992    There were no vitals filed for this visit.  Subjective Assessment - 09/13/18 0805    Subjective  Pt reports no new changes since last visit.      Currently in Pain?  Yes    Pain Score  2     Pain Location  Neck    Pain Orientation  Right;Lower    Pain Radiating Towards  into middle finger/ and weakness feeling into forearm    Aggravating Factors   prolonged sitting with arms on table, reaching, lifting overhead     Pain Relieving Factors  OTC meds; hot shower          [facility] PT Assessment - 09/13/18 0001      Assessment   Medical Diagnosis  Cervical radiculopathy     Referring Provider  Dr Annell Greening     Onset Date/Surgical Date  01/26/18   flare up of symptoms 06/25/18   Hand Dominance  Left    Next MD Visit  10/05/18      [facility] Adult PT  Treatment/Exercise - 09/13/18 0001      Neck Exercises: Standing   Neck Retraction  5 reps;5 secs    Other Standing Exercises  scap retraction x 5 sec x 5 reps; L's and W's - each 5-10 sec x 5 reps       Neck Exercises: Seated   Other Seated Exercise  thoracic ext over back of chair with hands supporting head x 5 sec x 4 reps      Neck Exercises: Supine   Other Supine Exercise  prolonged horiz abdct with Rt UE nerve glide x 10; then 5 reps on pool noodle.   active snow angels while hooklying on pool noodle x 10      Modalities   Modalities  Moist Heat;Electrical Stimulation      Moist Heat Therapy   Number Minutes Moist Heat  15 Minutes    Moist Heat Location  Cervical      Electrical Stimulation   Electrical Stimulation Location  Rt upper trap and neck    Electrical Stimulation Action  IFC    Electrical Stimulation Parameters  to tolerance  Electrical Stimulation Goals  Pain      Manual Therapy   Manual Therapy  Soft tissue mobilization    Manual therapy comments  pt seated    Soft tissue mobilization  IASTM with edge tool to Rt upper trap and cervical musculature to decrease fascial tightness and improve ROM.       Neck Exercises: Stretches   Other Neck Stretches  3 position doorway stretch x 30 sec x 3 reps each position             PT Education - 09/13/18 0846    Education Details  Posture, rationale for exercises.     Person(s) Educated  Patient    Methods  Explanation;Verbal cues;Tactile cues;Demonstration    Comprehension  Verbalized understanding;Returned demonstration          PT Long Term Goals - 09/10/18 1245      PT LONG TERM GOAL #1   Title  Improve posture and alignment with patient to demonstrate improved upright posture with posterior shoulder girdle engaged 10/19/18    Time  6    Period  Weeks    Status  New      PT LONG TERM GOAL #2   Title  Decrease pain and Rt UE radicular symptoms by 50-76% per pt report 10/19/18    Time  6     Period  Weeks    Status  New      PT LONG TERM GOAL #3   Title  Increase cervical ROM by 5-10 deg 10/19/18    Time  6    Period  Weeks    Status  New      PT LONG TERM GOAL #4   Title  Independent in HEP 10/19/18    Time  6    Period  Weeks    Status  New      PT LONG TERM GOAL #5   Title  Improve FOTO 43% limitation indicating improved function with less pain 10/19/18    Time  6    Period  Weeks    Status  New            Plan - 09/13/18 0846    Clinical Impression Statement  Pt tolerated all exercises well without increase in symptoms.  Pt reported slight reduction of neck pain with exercise and further with IASTM and MHP/estim.      Rehab Potential  Good    Clinical Impairments Affecting Rehab Potential  poor posture and alignment; prolonged postures and positions at work     PT Frequency  2x / week    PT Duration  6 weeks    PT Treatment/Interventions  Patient/family education;ADLs/Self Care Home Management;Cryotherapy;Electrical Stimulation;Iontophoresis 4mg /ml Dexamethasone;Moist Heat;Ultrasound;Dry needling;Manual techniques;Neuromuscular re-education;Functional mobility training;Therapeutic activities;Therapeutic exercise    PT Next Visit Plan  manual therapy to ant/lat/post cervical musculature. continue postural strengthening.     Consulted and Agree with Plan of Care  Patient       Patient will benefit from skilled therapeutic intervention in order to improve the following deficits and impairments:  Postural dysfunction, Improper body mechanics, Pain, Increased fascial restricitons, Increased muscle spasms, Decreased mobility, Decreased range of motion, Decreased activity tolerance  Visit Diagnosis: Cervicalgia  Abnormal posture  Other symptoms and signs involving the musculoskeletal system     Problem List Patient Active Problem List   Diagnosis Date Noted  . Other spondylosis with radiculopathy, cervical region 08/29/2018  . Spinal stenosis of  cervical region 08/29/2018  .  Anxiety and depression 08/10/2017  . S/P lumbar discectomy 03/18/2016  . Hyperlipidemia 10/27/2009  . ALCOHOL ABUSE 10/27/2009  . HYPERTENSION 10/27/2009   Mayer Camel, PTA 09/13/18 8:50 AM  [facility] [address] [place], [place], [address] Phone: [phone]   Fax:  [phone]  Name: Hershal Eriksson MRN: [id number] Date of Birth: Nov 11, 1976

## 2018-09-17 ENCOUNTER — Ambulatory Visit (INDEPENDENT_AMBULATORY_CARE_PROVIDER_SITE_OTHER): Payer: 59 | Admitting: Rehabilitative and Restorative Service Providers"

## 2018-09-17 ENCOUNTER — Encounter: Payer: Self-pay | Admitting: Rehabilitative and Restorative Service Providers"

## 2018-09-17 DIAGNOSIS — M5441 Lumbago with sciatica, right side: Secondary | ICD-10-CM

## 2018-09-17 DIAGNOSIS — R293 Abnormal posture: Secondary | ICD-10-CM | POA: Diagnosis not present

## 2018-09-17 DIAGNOSIS — G8929 Other chronic pain: Secondary | ICD-10-CM

## 2018-09-17 DIAGNOSIS — M542 Cervicalgia: Secondary | ICD-10-CM

## 2018-09-17 DIAGNOSIS — R29898 Other symptoms and signs involving the musculoskeletal system: Secondary | ICD-10-CM

## 2018-09-17 NOTE — Patient Instructions (Signed)
Resisted External Rotation: in Neutral - Bilateral   PALMS UP Sit or stand, tubing in both hands, elbows at sides, bent to 90, forearms forward. Pinch shoulder blades together and rotate forearms out. Keep elbows at sides. Repeat __10__ times per set. Do _2-3___ sets per session. Do _several___ sessions per day.   Low Row: Standing   Face anchor, feet shoulder width apart. Palms up, pull arms back, squeezing shoulder blades together. Repeat 10__ times per set. Do 2-3__ sets per session. Do 2-3__ sessions per week. Anchor Height: Waist   Strengthening: Resisted Extension   Hold tubing in right hand, arm forward. Pull arm back, elbow straight. Repeat _10___ times per set. Do 2-3____ sets per session. Do 2-3____ sessions per day.

## 2018-09-17 NOTE — Therapy (Signed)
[facility] [place] [address] [place] [address] [place], [place], [address] Phone: [phone]   Fax:  [phone]  Physical Therapy Treatment  Patient Details  Name: David Salazar MRN: [id number] Date of Birth: 10-14-1976 Referring Provider: Dr Annell Greening   Encounter Date: 09/17/2018  PT End of Session - 09/17/18 0719    Visit Number  3    Number of Visits  12    Date for PT Re-Evaluation  10/22/18    PT Start Time  0717    PT Stop Time  0810    PT Time Calculation (min)  53 min    Activity Tolerance  Patient tolerated treatment well       Past Medical History:  Diagnosis Date  . Alcohol abuse    Sober for 7 years ( 2017).   . Chronic lower back pain    "hopefull not after this OR today" (03/18/2016)  . PONV (postoperative nausea and vomiting)    "just w/my wisdom teeth" (03/18/2016)    Past Surgical History:  Procedure Laterality Date  . BACK SURGERY    . LUMBAR LAMINECTOMY/DECOMPRESSION MICRODISCECTOMY N/A 03/18/2016   Procedure: Right L5-S1 Microdiscectomy;  Surgeon: Eldred Manges, MD;  Location: [facility] OR;  Service: Orthopedics;  Laterality: N/A;  . MICRODISCECTOMY LUMBAR Right 03/18/2016   L5-S1  . WISDOM TOOTH EXTRACTION  1992    There were no vitals filed for this visit.  Subjective Assessment - 09/17/18 0720    Subjective  Can tell he is making some progress. Having less discomfort and pain. Notices some "stiffness" in the neck this morning and can tell the Rt arm is weaker than the Lt with exercises.     Currently in Pain?  Yes    Pain Score  1     Pain Location  Neck    Pain Orientation  Right;Lower    Pain Descriptors / Indicators  Tightness;Tingling;Nagging;Numbness         [facility] PT Assessment - 09/17/18 0001      Assessment   Medical Diagnosis  Cervical radiculopathy     Referring Provider  Dr Annell Greening    Onset Date/Surgical Date  01/26/18   flare up of symptoms 06/25/18   Hand Dominance  Left    Next MD  Visit  10/05/18      Sensation   Additional Comments  numbness and tingling Rt forearm and dorsal hand into the long and index fingers - intermittently      Palpation   Spinal mobility  hypomobile cervical and thoracic spine with CPA mobs     Palpation comment  significant muscular tightness Rt > Lt ant/lat/post cervical musculature; upper traps; leveator; pecs                    [facility] Adult PT Treatment/Exercise - 09/17/18 0001      Neck Exercises: Standing   Other Standing Exercises  scap retraction x 5 sec x 5 reps; L's and W's - each 5-10 sec x 5 reps       Shoulder Exercises: Standing   Extension  Strengthening;Right;Left;10 reps;Theraband    Theraband Level (Shoulder Extension)  Level 2 (Red)    Row  Strengthening;Right;Left;10 reps;Theraband    Theraband Level (Shoulder Row)  Level 2 (Red)    Retraction  Strengthening;Right;Left;20 reps;Theraband    Theraband Level (Shoulder Retraction)  Level 2 (Red)      Moist Heat Therapy   Number Minutes Moist Heat  20 Minutes  Moist Heat Location  Cervical   thoracic     Electrical Stimulation   Electrical Stimulation Location  bilat cervical; Rt upper trap; medial scap border     Electrical Stimulation Action  IFC    Electrical Stimulation Parameters  to tolerance     Electrical Stimulation Goals  Pain      Manual Therapy   Manual Therapy  Soft tissue mobilization    Manual therapy comments  pt supine and sitting     Joint Mobilization  CPA cervical mobs pt supine (increased tingling with cervical mobs)    Soft tissue mobilization  deep tissue work throught ant/midddle/post cervical spine musculature; into pecs/upper trap (medial scapular border in sitting)    Myofascial Release  anterior cervical to pecs    Scapular Mobilization  Rt lateral and inferior       Neck Exercises: Stretches   Other Neck Stretches  3 position doorway stretch x 30 sec x 3 reps each position    Other Neck Stretches  shoulder flexion arms  overhead in doorway 15-20 sec some tingling - pt will gradually increase time              PT Education - 09/17/18 0734    Education Details  HEP     Person(s) Educated  Patient    Methods  Explanation;Demonstration;Tactile cues;Verbal cues;Handout    Comprehension  Verbalized understanding;Returned demonstration;Verbal cues required;Tactile cues required          PT Long Term Goals - 09/17/18 0719      PT LONG TERM GOAL #1   Title  Improve posture and alignment with patient to demonstrate improved upright posture with posterior shoulder girdle engaged 10/19/18    Time  6    Period  Weeks    Status  On-going      PT LONG TERM GOAL #2   Title  Decrease pain and Rt UE radicular symptoms by 50-76% per pt report 10/19/18    Time  6    Period  Weeks    Status  On-going      PT LONG TERM GOAL #3   Title  Increase cervical ROM by 5-10 deg 10/19/18    Time  6    Period  Weeks    Status  On-going      PT LONG TERM GOAL #4   Title  Independent in HEP 10/19/18    Time  6    Period  Weeks    Status  On-going      PT LONG TERM GOAL #5   Title  Improve FOTO 43% limitation indicating improved function with less pain 10/19/18    Time  6    Period  Weeks    Status  On-going            Plan - 09/17/18 0757    Clinical Impression Statement  Patient reports some improvement. Patient demonstrates some improvement in cervical and thoracic posture and alignment. Patient has significant muscular tightness through the ant/middle/posterior cervical spine musculature; upper trap; leveator; pecs. Soft tissue work and spinal mobs create Rt UE symptoms at times - resolve within a few seconds. Patient is progressing well toward stated goals of therapy.     Rehab Potential  Good    Clinical Impairments Affecting Rehab Potential  poor posture and alignment; prolonged postures and positions at work     PT Frequency  2x / week    PT Duration  6 weeks  PT Treatment/Interventions   Patient/family education;ADLs/Self Care Home Management;Cryotherapy;Electrical Stimulation;Iontophoresis 4mg /ml Dexamethasone;Moist Heat;Ultrasound;Dry needling;Manual techniques;Neuromuscular re-education;Functional mobility training;Therapeutic activities;Therapeutic exercise    PT Next Visit Plan  manual therapy to ant/lat/post cervical musculature. continue postural strengthening.     Consulted and Agree with Plan of Care  Patient       Patient will benefit from skilled therapeutic intervention in order to improve the following deficits and impairments:  Postural dysfunction, Improper body mechanics, Pain, Increased fascial restricitons, Increased muscle spasms, Decreased mobility, Decreased range of motion, Decreased activity tolerance  Visit Diagnosis: Cervicalgia  Abnormal posture  Other symptoms and signs involving the musculoskeletal system  Chronic bilateral low back pain with right-sided sciatica     Problem List Patient Active Problem List   Diagnosis Date Noted  . Other spondylosis with radiculopathy, cervical region 08/29/2018  . Spinal stenosis of cervical region 08/29/2018  . Anxiety and depression 08/10/2017  . S/P lumbar discectomy 03/18/2016  . Hyperlipidemia 10/27/2009  . ALCOHOL ABUSE 10/27/2009  . HYPERTENSION 10/27/2009    Elice Crigger Rober MinionP Keevan Wolz PT, MPH  09/17/2018, 8:00 AM  [facility] [address] [place] [address] [place], [place], [address] Phone: [phone]   Fax:  [phone]  Name: Adelene IdlerCraig Matthew Tomassi MRN: [id number] Date of Birth: 01-04-1976

## 2018-09-19 ENCOUNTER — Ambulatory Visit (INDEPENDENT_AMBULATORY_CARE_PROVIDER_SITE_OTHER): Payer: 59 | Admitting: Physical Therapy

## 2018-09-19 DIAGNOSIS — R293 Abnormal posture: Secondary | ICD-10-CM | POA: Diagnosis not present

## 2018-09-19 DIAGNOSIS — R29898 Other symptoms and signs involving the musculoskeletal system: Secondary | ICD-10-CM

## 2018-09-19 DIAGNOSIS — M542 Cervicalgia: Secondary | ICD-10-CM

## 2018-09-19 NOTE — Therapy (Signed)
[facility] [place] [address] [place] [address] [place], [place], [address] Phone: [phone]   Fax:  [phone]  Physical Therapy Treatment  Patient Details  Name: David Salazar MRN: [id number] Date of Birth: [birth year] Referring Provider: Dr Annell Greening   Encounter Date: 09/19/2018  PT End of Session - 09/19/18 0800    Visit Number  4    Number of Visits  12    Date for PT Re-Evaluation  10/22/18    PT Start Time  0720    PT Stop Time  0758    PT Time Calculation (min)  38 min    Activity Tolerance  Patient tolerated treatment well;No increased pain    Behavior During Therapy  WFL for tasks assessed/performed       Past Medical History:  Diagnosis Date  . Alcohol abuse    Sober for 7 years ( 2017).   . Chronic lower back pain    "hopefull not after this OR today" (03/18/2016)  . PONV (postoperative nausea and vomiting)    "just w/my wisdom teeth" (03/18/2016)    Past Surgical History:  Procedure Laterality Date  . BACK SURGERY    . LUMBAR LAMINECTOMY/DECOMPRESSION MICRODISCECTOMY N/A 03/18/2016   Procedure: Right L5-S1 Microdiscectomy;  Surgeon: Eldred Manges, MD;  Location: [facility] OR;  Service: Orthopedics;  Laterality: N/A;  . MICRODISCECTOMY LUMBAR Right 03/18/2016   L5-S1  . WISDOM TOOTH EXTRACTION  1992    There were no vitals filed for this visit.  Subjective Assessment - 09/19/18 0722    Subjective  Pt reports he has been more consistent with posture, and noticing "what sets things off". He states he has increased symptoms with cervical ext, Rt rotation, axial ext past neutral, and Rt lateral flexion.     Currently in Pain?  Yes    Pain Score  2     Pain Location  Neck    Pain Orientation  Right;Lower    Pain Descriptors / Indicators  Tightness;Tingling;Nagging    Pain Radiating Towards  into first two fingers and forearm    Aggravating Factors   see above statement    Pain Relieving Factors  hot shower, OTC meds           [facility] PT Assessment - 09/19/18 0001      Assessment   Medical Diagnosis  Cervical radiculopathy     Referring Provider  Dr Annell Greening    Onset Date/Surgical Date  01/26/18   flare up of symptoms 06/25/18   Hand Dominance  Left    Next MD Visit  10/05/18      ROM / Strength   AROM / PROM / Strength  Strength      Strength   Strength Assessment Site  Hand    Right/Left hand  Left;Right    Right Hand Grip (lbs)  66    Left Hand Grip (lbs)  105       [facility] Adult PT Treatment/Exercise - 09/19/18 0001      Exercises   Exercises  Neck      Neck Exercises: Machines for Strengthening   UBE (Upper Arm Bike)  L1: 1.5 min each direction, in standing.  Cues to not stand so rigid      Neck Exercises: Seated   Other Seated Exercise  thoracic ext over back of chair with hands supporting head x 5 sec x 4 reps      Neck Exercises: Supine   Other Supine Exercise  prolonged horiz abdct with Rt UE nerve glide x 10.   active snow angels x 10    Other Supine Exercise  W's x 5 sec hold x 10 reps, overhead pull (without holding anything) x 10 (5 sec pause)        Modalities   Modalities  --   pt declined     Manual Therapy   Soft tissue mobilization  STM to Rt scalenes, upper trap.  TPR and cross fiber friction to Rt upper thoracic paraspinals.     Myofascial Release  anterior cervical to pecs      Neck Exercises: Stretches   Other Neck Stretches  3 position doorway stretch x 30 sec x 3 reps each position             PT Education - 09/19/18 0734    Education Details  anatomy of neck and how it relates to his symptoms, rationale for treatment.     Person(s) Educated  Patient    Methods  Explanation    Comprehension  Verbalized understanding          PT Long Term Goals - 09/17/18 0719      PT LONG TERM GOAL #1   Title  Improve posture and alignment with patient to demonstrate improved upright posture with posterior shoulder girdle engaged 10/19/18    Time  6     Period  Weeks    Status  On-going      PT LONG TERM GOAL #2   Title  Decrease pain and Rt UE radicular symptoms by 50-76% per pt report 10/19/18    Time  6    Period  Weeks    Status  On-going      PT LONG TERM GOAL #3   Title  Increase cervical ROM by 5-10 deg 10/19/18    Time  6    Period  Weeks    Status  On-going      PT LONG TERM GOAL #4   Title  Independent in HEP 10/19/18    Time  6    Period  Weeks    Status  On-going      PT LONG TERM GOAL #5   Title  Improve FOTO 43% limitation indicating improved function with less pain 10/19/18    Time  6    Period  Weeks    Status  On-going            Plan - 09/19/18 1610    Clinical Impression Statement  Pt demonstrated decreased grip strength in Rt hand vs Lt.  He reported elimination of symptoms in Rt UE after completing pec stretch and nerve glide in supine. Pt has tightness in all Rt scalenes and levator; would benefit from further work in this area.  Pt reported reduction of symptoms (when in standing ) at end of session.  Pt declined modalities at end of session; needed to return to work.     Rehab Potential  Good    Clinical Impairments Affecting Rehab Potential  poor posture and alignment; prolonged postures and positions at work     PT Frequency  2x / week    PT Duration  6 weeks    PT Treatment/Interventions  Patient/family education;ADLs/Self Care Home Management;Cryotherapy;Electrical Stimulation;Iontophoresis 4mg /ml Dexamethasone;Moist Heat;Ultrasound;Dry needling;Manual techniques;Neuromuscular re-education;Functional mobility training;Therapeutic activities;Therapeutic exercise    PT Next Visit Plan  manual therapy to ant/lat/post cervical musculature. continue postural strengthening.     Consulted and Agree with Plan of Care  Patient       Patient will benefit from skilled therapeutic intervention in order to improve the following deficits and impairments:  Postural dysfunction, Improper body mechanics,  Pain, Increased fascial restricitons, Increased muscle spasms, Decreased mobility, Decreased range of motion, Decreased activity tolerance  Visit Diagnosis: Cervicalgia  Abnormal posture  Other symptoms and signs involving the musculoskeletal system     Problem List Patient Active Problem List   Diagnosis Date Noted  . Other spondylosis with radiculopathy, cervical region 08/29/2018  . Spinal stenosis of cervical region 08/29/2018  . Anxiety and depression 08/10/2017  . S/P lumbar discectomy 03/18/2016  . Hyperlipidemia 10/27/2009  . ALCOHOL ABUSE 10/27/2009  . HYPERTENSION 10/27/2009   Mayer CamelJennifer Carlson-Long, PTA 09/19/18 11:49 AM  [facility] [place] [address] [place] [address] [place], [place], [address] Phone: [phone]   Fax:  [phone]  Name: Adelene IdlerCraig Matthew Shira MRN: [id number] Date of Birth: [birth year]

## 2018-09-24 ENCOUNTER — Encounter: Payer: Self-pay | Admitting: Rehabilitative and Restorative Service Providers"

## 2018-09-24 ENCOUNTER — Ambulatory Visit: Payer: 59 | Admitting: Rehabilitative and Restorative Service Providers"

## 2018-09-24 DIAGNOSIS — G8929 Other chronic pain: Secondary | ICD-10-CM

## 2018-09-24 DIAGNOSIS — M5441 Lumbago with sciatica, right side: Secondary | ICD-10-CM | POA: Diagnosis not present

## 2018-09-24 DIAGNOSIS — R293 Abnormal posture: Secondary | ICD-10-CM

## 2018-09-24 DIAGNOSIS — M542 Cervicalgia: Secondary | ICD-10-CM | POA: Diagnosis not present

## 2018-09-24 DIAGNOSIS — R29898 Other symptoms and signs involving the musculoskeletal system: Secondary | ICD-10-CM | POA: Diagnosis not present

## 2018-09-24 DIAGNOSIS — M6281 Muscle weakness (generalized): Secondary | ICD-10-CM

## 2018-09-24 NOTE — Therapy (Signed)
[facility] [place] [address] [place] [address] [place], [place], [address] Phone: [phone]   Fax:  [phone]  Physical Therapy Treatment  Patient Details  Name: Wasyl Dornfeld MRN: [id number] Date of Birth: 04/14/1976 Referring Provider (PT): Dr Annell Greening    Encounter Date: 09/24/2018  PT End of Session - 09/24/18 0720    Visit Number  5    Number of Visits  12    Date for PT Re-Evaluation  10/22/18    PT Start Time  0719    PT Stop Time  0800    PT Time Calculation (min)  41 min    Activity Tolerance  Patient tolerated treatment well       Past Medical History:  Diagnosis Date  . Alcohol abuse    Sober for 7 years ( 2017).   . Chronic lower back pain    "hopefull not after this OR today" (03/18/2016)  . PONV (postoperative nausea and vomiting)    "just w/my wisdom teeth" (03/18/2016)    Past Surgical History:  Procedure Laterality Date  . BACK SURGERY    . LUMBAR LAMINECTOMY/DECOMPRESSION MICRODISCECTOMY N/A 03/18/2016   Procedure: Right L5-S1 Microdiscectomy;  Surgeon: Eldred Manges, MD;  Location: [facility] OR;  Service: Orthopedics;  Laterality: N/A;  . MICRODISCECTOMY LUMBAR Right 03/18/2016   L5-S1  . WISDOM TOOTH EXTRACTION  1992    There were no vitals filed for this visit.  Subjective Assessment - 09/24/18 0721    Subjective  No pain if he holds his head straight ahead. Has numbness and pain with looking to the Rt or up and it is worse with looking up and to the Rt at the same time. Sympotms will resolve if he doesn't hold the position too long. He sees MD 10/05/18    Currently in Pain?  No/denies    Pain Score  0-No pain         [facility] PT Assessment - 09/24/18 0001      Assessment   Medical Diagnosis  Cervical radiculopathy     Referring Provider (PT)  Dr Annell Greening     Onset Date/Surgical Date  01/26/18   flare up of symptoms 06/25/18   Hand Dominance  Left    Next MD Visit  10/05/18      AROM   Overall  AROM Comments  limited end range shoulder ROM Rt due to painin the Rt neck into the shoulder     Cervical Flexion  47   tightness   Cervical Extension  25   pain   Cervical - Right Side Bend  25   pain    Cervical - Left Side Bend  30   pulling    Cervical - Right Rotation  43   discomfort    Cervical - Left Rotation  62      Palpation   Spinal mobility  hypomobile cervical and thoracic spine with CPA mobs     Palpation comment  significant muscular tightness Rt > Lt ant/lat/post cervical musculature; upper traps; leveator; pecs                    [facility] Adult PT Treatment/Exercise - 09/24/18 0001      Exercises   Exercises  --   grip with foam - provided for home for strengthening      Neck Exercises: Machines for Strengthening   UBE (Upper Arm Bike)  L1: 4 min total alternating directions  Neck Exercises: Standing   Other Standing Exercises  scap retraction x 5 sec x 5 reps; L's and W's - each 5-10 sec x 5 reps       Shoulder Exercises: Standing   Extension  Strengthening;Right;Left;10 reps;Theraband    Theraband Level (Shoulder Extension)  Level 3 (Green)    Row  Strengthening;Right;Left;10 reps;Theraband    Theraband Level (Shoulder Row)  Level 3 (Green)    Row Limitations  added bow and arrow rowing green TB x 10 each side     Retraction  Strengthening;Right;Left;20 reps;Theraband    Theraband Level (Shoulder Retraction)  Level 3 (Green)      Moist Heat Therapy   Number Minutes Moist Heat  15 Minutes    Moist Heat Location  Cervical   thoracic      Electrical Stimulation   Electrical Stimulation Location  Rt cervical and upper trap area     Electrical Stimulation Action  IFC    Electrical Stimulation Parameters  to tolerance    Electrical Stimulation Goals  Pain;Tone      Traction   Type of Traction  Cervical    Min (lbs)  14    Max (lbs)  20    Hold Time  30    Rest Time  15    Time  15      Neck Exercises: Stretches   Other Neck  Stretches  3 position doorway stretch x 30 sec x 3 reps each position    Other Neck Stretches  shoulder flexion arms overhead in doorway 20-30 sec                   PT Long Term Goals - 09/17/18 0719      PT LONG TERM GOAL #1   Title  Improve posture and alignment with patient to demonstrate improved upright posture with posterior shoulder girdle engaged 10/19/18    Time  6    Period  Weeks    Status  On-going      PT LONG TERM GOAL #2   Title  Decrease pain and Rt UE radicular symptoms by 50-76% per pt report 10/19/18    Time  6    Period  Weeks    Status  On-going      PT LONG TERM GOAL #3   Title  Increase cervical ROM by 5-10 deg 10/19/18    Time  6    Period  Weeks    Status  On-going      PT LONG TERM GOAL #4   Title  Independent in HEP 10/19/18    Time  6    Period  Weeks    Status  On-going      PT LONG TERM GOAL #5   Title  Improve FOTO 43% limitation indicating improved function with less pain 10/19/18    Time  6    Period  Weeks    Status  On-going            Plan - 09/24/18 0752    Clinical Impression Statement  Patient reports decreased muscular tightness Rt cervical area but continued radicular symptoms with turning head to Rt and up. Muscular tightness Rt ant/lat/post cervical area continues. Trial of mechanical traction tolerated well. Some improvement in cervical ROM in some planes - decrease in others. Intermittent radicular symptoms persist. RTD 10/05/18.     Rehab Potential  Good    Clinical Impairments Affecting Rehab Potential  poor posture and alignment; prolonged  postures and positions at work     PT Frequency  2x / week    PT Duration  6 weeks    PT Treatment/Interventions  Patient/family education;ADLs/Self Care Home Management;Cryotherapy;Electrical Stimulation;Iontophoresis 4mg /ml Dexamethasone;Moist Heat;Ultrasound;Dry needling;Manual techniques;Neuromuscular re-education;Functional mobility training;Therapeutic  activities;Therapeutic exercise    PT Next Visit Plan  manual therapy to ant/lat/post cervical musculature. continue postural strengthening.     Consulted and Agree with Plan of Care  Patient       Patient will benefit from skilled therapeutic intervention in order to improve the following deficits and impairments:  Postural dysfunction, Improper body mechanics, Pain, Increased fascial restricitons, Increased muscle spasms, Decreased mobility, Decreased range of motion, Decreased activity tolerance  Visit Diagnosis: Cervicalgia  Abnormal posture  Other symptoms and signs involving the musculoskeletal system  Chronic bilateral low back pain with right-sided sciatica  Muscle weakness (generalized)     Problem List Patient Active Problem List   Diagnosis Date Noted  . Other spondylosis with radiculopathy, cervical region 08/29/2018  . Spinal stenosis of cervical region 08/29/2018  . Anxiety and depression 08/10/2017  . S/P lumbar discectomy 03/18/2016  . Hyperlipidemia 10/27/2009  . ALCOHOL ABUSE 10/27/2009  . HYPERTENSION 10/27/2009    Zymire Turnbo Rober Minion PT, MPH  09/24/2018, 7:55 AM  [facility] [address] [place] [address] [place], [place], [address] Phone: [phone]   Fax:  [phone]  Name: [place] Antolin MRN: [id number] Date of Birth: [birth year]

## 2018-09-26 ENCOUNTER — Ambulatory Visit: Payer: 59 | Admitting: Physical Therapy

## 2018-09-26 DIAGNOSIS — M542 Cervicalgia: Secondary | ICD-10-CM | POA: Diagnosis not present

## 2018-09-26 DIAGNOSIS — R29898 Other symptoms and signs involving the musculoskeletal system: Secondary | ICD-10-CM | POA: Diagnosis not present

## 2018-09-26 DIAGNOSIS — R293 Abnormal posture: Secondary | ICD-10-CM | POA: Diagnosis not present

## 2018-09-26 NOTE — Therapy (Signed)
[facility] [address] [place], [place], [address] Phone: [phone]   Fax:  [phone]  Physical Therapy Treatment  Patient Details  Name: David Salazar MRN: [id number] Date of Birth: 1976/09/01 Referring Provider (PT): Dr Annell Greening    Encounter Date: 09/26/2018  PT End of Session - 09/26/18 0723    Visit Number  6    Number of Visits  12    Date for PT Re-Evaluation  10/22/18    PT Start Time  0720   pt arrived late   PT Stop Time  0814    PT Time Calculation (min)  54 min    Activity Tolerance  Patient tolerated treatment well;No increased pain    Behavior During Therapy  WFL for tasks assessed/performed       Past Medical History:  Diagnosis Date  . Alcohol abuse    Sober for 7 years ( 2017).   . Chronic lower back pain    "hopefull not after this OR today" (03/18/2016)  . PONV (postoperative nausea and vomiting)    "just w/my wisdom teeth" (03/18/2016)    Past Surgical History:  Procedure Laterality Date  . BACK SURGERY    . LUMBAR LAMINECTOMY/DECOMPRESSION MICRODISCECTOMY N/A 03/18/2016   Procedure: Right L5-S1 Microdiscectomy;  Surgeon: Eldred Manges, MD;  Location: [facility] OR;  Service: Orthopedics;  Laterality: N/A;  . MICRODISCECTOMY LUMBAR Right 03/18/2016   L5-S1  . WISDOM TOOTH EXTRACTION  1992    There were no vitals filed for this visit.  Subjective Assessment - 09/26/18 0723    Subjective  Pt reports he felt great immediately after traction (until about 2:30pm).  But his symptoms in his RUE worsened that night. "I'm being very protective now that I know what makes things worse" (pt holding head in guarded manner, avoiding cervical extension and Rt rotation).      Patient Stated Goals  improve symptoms and avoid surgery     Currently in Pain?  Yes    Pain Score  2     Pain Location  Neck    Pain Orientation  Right;Lower    Pain Descriptors / Indicators  Dull;Aching    Pain Radiating  Towards  into first two fingers and forearm    Aggravating Factors   cervical rotation Rt, ext    Pain Relieving Factors  hot shower, OTC meds         [facility] PT Assessment - 09/26/18 0001      Assessment   Medical Diagnosis  Cervical radiculopathy     Referring Provider (PT)  Dr Annell Greening     Onset Date/Surgical Date  01/26/18   flare up of symptoms 06/25/18   Hand Dominance  Left    Next MD Visit  10/05/18        [facility] Adult PT Treatment/Exercise - 09/26/18 0001      Neck Exercises: Machines for Strengthening   UBE (Upper Arm Bike)  L1: 1.5 min each direction, in standing.        Neck Exercises: Standing   Other Standing Exercises  lat stretch with hands on back of truck x 10 sec x 4 reps       Neck Exercises: Supine   Other Supine Exercise  prolonged horiz abdct with Rt UE nerve glide x 10.   active snow angels x 10      Shoulder Exercises: Standing   Row  Strengthening;Right;Left;10 reps;Theraband   (2 total sets)  Theraband Level (Shoulder Row)  Level 3 (Green)    Row Limitations  added bow and arrow rowing green TB x 10 each side     Retraction  Strengthening;Right;Left;Theraband;10 reps   bilat ER   Theraband Level (Shoulder Retraction)  Level 2 (Red)      Moist Heat Therapy   Number Minutes Moist Heat  15 Minutes    Moist Heat Location  Cervical   thoracic      Electrical Stimulation   Electrical Stimulation Location  Rt upper trap and post shoulder    Electrical Stimulation Action  IFC    Electrical Stimulation Parameters   to tolerance     Electrical Stimulation Goals  Tone;Pain      Traction   Type of Traction  Cervical    Min (lbs)  14    Max (lbs)  20    Hold Time  30    Rest Time  15    Time  15      Manual Therapy   Soft tissue mobilization  IASTM with edge tool to Rt lateral forearm to distal lateral tricep to decrease fascial tightness and pain      Neck Exercises: Stretches   Other Neck Stretches  3 position doorway stretch x 30 sec x 2  reps each position (high position performed unilaterally)             PT Education - 09/26/18 0804    Education Details  Encouraged pt to monitor Rt shoulder/hand position while at work; may be aggrivating radicular symptoms.     Person(s) Educated  Patient    Methods  Explanation    Comprehension  Verbalized understanding          PT Long Term Goals - 09/17/18 0719      PT LONG TERM GOAL #1   Title  Improve posture and alignment with patient to demonstrate improved upright posture with posterior shoulder girdle engaged 10/19/18    Time  6    Period  Weeks    Status  On-going      PT LONG TERM GOAL #2   Title  Decrease pain and Rt UE radicular symptoms by 50-76% per pt report 10/19/18    Time  6    Period  Weeks    Status  On-going      PT LONG TERM GOAL #3   Title  Increase cervical ROM by 5-10 deg 10/19/18    Time  6    Period  Weeks    Status  On-going      PT LONG TERM GOAL #4   Title  Independent in HEP 10/19/18    Time  6    Period  Weeks    Status  On-going      PT LONG TERM GOAL #5   Title  Improve FOTO 43% limitation indicating improved function with less pain 10/19/18    Time  6    Period  Weeks    Status  On-going            Plan - 09/26/18 0806    Clinical Impression Statement  Pt had positive response to traction last session.  Pt reported elimination of radicular symptoms with traction today.  Pt has some fascial tightness along superficial back arm line; improved with IASTM to area. Pt tolerated exercises well without increase in symptoms.     Rehab Potential  Good    Clinical Impairments Affecting Rehab Potential  poor posture  and alignment; prolonged postures and positions at work     PT Frequency  2x / week    PT Duration  6 weeks    PT Treatment/Interventions  Patient/family education;ADLs/Self Care Home Management;Cryotherapy;Electrical Stimulation;Iontophoresis 4mg /ml Dexamethasone;Moist Heat;Ultrasound;Dry needling;Manual  techniques;Neuromuscular re-education;Functional mobility training;Therapeutic activities;Therapeutic exercise    PT Next Visit Plan  manual therapy to ant/lat/post cervical musculature. continue postural strengthening. Assess response to traction.     Consulted and Agree with Plan of Care  Patient       Patient will benefit from skilled therapeutic intervention in order to improve the following deficits and impairments:  Postural dysfunction, Improper body mechanics, Pain, Increased fascial restricitons, Increased muscle spasms, Decreased mobility, Decreased range of motion, Decreased activity tolerance  Visit Diagnosis: Cervicalgia  Abnormal posture  Other symptoms and signs involving the musculoskeletal system     Problem List Patient Active Problem List   Diagnosis Date Noted  . Other spondylosis with radiculopathy, cervical region 08/29/2018  . Spinal stenosis of cervical region 08/29/2018  . Anxiety and depression 08/10/2017  . S/P lumbar discectomy 03/18/2016  . Hyperlipidemia 10/27/2009  . ALCOHOL ABUSE 10/27/2009  . HYPERTENSION 10/27/2009   Mayer Camel, PTA 09/26/18 9:21 AM  [facility] [address] [place], [place], [address] Phone: [phone]   Fax:  [phone]  Name: Devaunte Gasparini MRN: [id number] Date of Birth: 06/17/1976

## 2018-10-02 ENCOUNTER — Ambulatory Visit (INDEPENDENT_AMBULATORY_CARE_PROVIDER_SITE_OTHER): Payer: 59 | Admitting: Physical Therapy

## 2018-10-02 ENCOUNTER — Encounter: Payer: 59 | Admitting: Physical Therapy

## 2018-10-02 DIAGNOSIS — R29898 Other symptoms and signs involving the musculoskeletal system: Secondary | ICD-10-CM | POA: Diagnosis not present

## 2018-10-02 DIAGNOSIS — R293 Abnormal posture: Secondary | ICD-10-CM

## 2018-10-02 DIAGNOSIS — M542 Cervicalgia: Secondary | ICD-10-CM

## 2018-10-02 NOTE — Therapy (Addendum)
[place], [place], [address] Phone: [phone]   Fax:  [phone]  Physical Therapy Treatment  Patient Details  Name: David Salazar MRN: [id number] Date of Birth: [birth year] Referring Provider (PT): Dr Rodell Perna    Encounter Date: 10/02/2018  PT End of Session - 10/02/18 0811    Visit Number  7    Number of Visits  12    Date for PT Re-Evaluation  10/22/18    PT Start Time  0802    PT Stop Time  0902    PT Time Calculation (min)  60 min    Behavior During Therapy  [facility] for tasks assessed/performed       Past Medical History:  Diagnosis Date  . Alcohol abuse    Sober for 7 years ( 2017).   . Chronic lower back pain    "hopefull not after this OR today" (03/18/2016)  . PONV (postoperative nausea and vomiting)    "just w/my wisdom teeth" (03/18/2016)    Past Surgical History:  Procedure Laterality Date  . BACK SURGERY    . LUMBAR LAMINECTOMY/DECOMPRESSION MICRODISCECTOMY N/A 03/18/2016   Procedure: Right L5-S1 Microdiscectomy;  Surgeon: Marybelle Killings, MD;  Location: [place];  Service: Orthopedics;  Laterality: N/A;  . MICRODISCECTOMY LUMBAR Right 03/18/2016   L5-S1  . WISDOM TOOTH EXTRACTION  1992    There were no vitals filed for this visit.  Subjective Assessment - 10/02/18 0803    Subjective  Pt reports he didn't noticed as much difference after last traction session.  He avoids looking up. He has modified the position of his arm for use of mouse at work station.      Pertinent History  lumbar microdiscetomy 03/18/16 with improvement in LBP and LE symptoms; Depression and anxiety     Patient Stated Goals  improve symptoms and avoid surgery     Currently in Pain?  Yes    Pain Score  2     Pain Location  Neck    Pain Orientation  Right    Pain Descriptors / Indicators  Dull;Aching    Aggravating Factors   looking up, Rt cervical rotation     Pain Relieving Factors  hot shower,  OTC meds          [facility] PT Assessment - 10/02/18 0001      Assessment   Medical Diagnosis  Cervical radiculopathy     Referring Provider (PT)  Dr Rodell Perna     Onset Date/Surgical Date  01/26/18   flare up of symptoms 06/25/18   Hand Dominance  Left    Next MD Visit  10/05/18      AROM   Cervical Flexion  30    Cervical Extension  15   instant radicular symptoms in RUE   Cervical - Right Side Bend  34   with pain in Rt neck    Cervical - Left Side Bend  40    Cervical - Right Rotation  38   pain on Rt side of neck   Cervical - Left Rotation  73      Strength   Right/Left hand  Right;Left    Right Hand Grip (lbs)  85    Left Hand Grip (lbs)  102       [facility] Adult PT Treatment/Exercise - 10/02/18 0001      Neck Exercises: Machines for Strengthening   UBE (Upper Arm Bike)  L2: 1.5 min  each direction, in standing.        Neck Exercises: Supine   Other Supine Exercise  prolonged horiz abdct with Rt UE nerve glide x 10.   active snow angels x 10    Other Supine Exercise  Rt shoulder flexion stretch to end range x 10 sec x 4 reps       Moist Heat Therapy   Number Minutes Moist Heat  19 Minutes    Moist Heat Location  Cervical   thoracic      Electrical Stimulation   Electrical Stimulation Location  Rt upper trap and post shoulder    Electrical Stimulation Action  IFC    Electrical Stimulation Parameters   to tolerance    Electrical Stimulation Goals  Tone;Pain      Traction   Type of Traction  Cervical    Min (lbs)  14    Max (lbs)  20    Hold Time  30    Rest Time  15    Time  19      Manual Therapy   Soft tissue mobilization  STM to Rt scalenes, upper trap, cervical paraspinals.  TPR and cross fiber friction to Rt upper thoracic paraspinals. Rt pec release; STM to Rt tricep      Neck Exercises: Stretches   Other Neck Stretches  3 position doorway stretch x 30 sec x 3 reps each position    Other Neck Stretches  shoulder flexion arms overhead in doorway 20-30  sec x 2 reps                  PT Long Term Goals - 10/02/18 0812      PT LONG TERM GOAL #1   Title  Improve posture and alignment with patient to demonstrate improved upright posture with posterior shoulder girdle engaged 10/19/18    Time  6    Period  Weeks      PT LONG TERM GOAL #2   Title  Decrease pain and Rt UE radicular symptoms by 50-76% per pt report 10/19/18    Time  6    Period  Weeks    Status  On-going   reduction of 30%, 10/02/18     PT LONG TERM GOAL #3   Title  Increase cervical ROM by 5-10 deg 10/19/18    Time  6    Period  Weeks    Status  On-going      PT LONG TERM GOAL #4   Title  Independent in HEP 10/19/18    Time  6    Period  Weeks    Status  On-going      PT LONG TERM GOAL #5   Title  Improve FOTO 43% limitation indicating improved function with less pain 10/19/18    Time  6    Period  Weeks    Status  On-going            Plan - 10/02/18 0850    Clinical Impression Statement  Pt's Rt grip strength has improved from 66# to 85#; continues to be limited compared to LUE.  Pt is reporting 30% reduction of radicular symptoms since starting therapy.  Slight increase in pain in neck with STM to paraspinals around C5-6 and subocciptals; reduced after MHP/ traction.  Pt progressing towards goals.     Clinical Impairments Affecting Rehab Potential  poor posture and alignment; prolonged postures and positions at work     PT Frequency  2x /  week    PT Duration  6 weeks    PT Treatment/Interventions  Patient/family education;ADLs/Self Care Home Management;Cryotherapy;Electrical Stimulation;Iontophoresis 15m/ml Dexamethasone;Moist Heat;Ultrasound;Dry needling;Manual techniques;Neuromuscular re-education;Functional mobility training;Therapeutic activities;Therapeutic exercise    PT Next Visit Plan  Pt requests to hold therapy until MD appt; will hold and await advisement.     Consulted and Agree with Plan of Care  Patient       Patient will  benefit from skilled therapeutic intervention in order to improve the following deficits and impairments:  Postural dysfunction, Improper body mechanics, Pain, Increased fascial restricitons, Increased muscle spasms, Decreased mobility, Decreased range of motion, Decreased activity tolerance  Visit Diagnosis: Cervicalgia  Abnormal posture  Other symptoms and signs involving the musculoskeletal system     Problem List Patient Active Problem List   Diagnosis Date Noted  . Other spondylosis with radiculopathy, cervical region 08/29/2018  . Spinal stenosis of cervical region 08/29/2018  . Anxiety and depression 08/10/2017  . S/P lumbar discectomy 03/18/2016  . Hyperlipidemia 10/27/2009  . ALCOHOL ABUSE 10/27/2009  . HYPERTENSION 10/27/2009   JKerin Perna PTA 10/02/18 1:24 PM  [place] [address] [address]Phone: [phone]  Fax:  [phone] Name: David CrescenzoMRN: [id number] of Birth: 2Jan 17, 1977 PHYSICAL THERAPY DISCHARGE SUMMARY  Visits from Start of Care: 7  Current functional level related to goals / functional outcomes: See progress note for discharge status   Remaining deficits: Unknown - persistent symptoms    Education / Equipment: HEP  Plan: Patient agrees to discharge.  Patient goals were not met. Patient is being discharged due to                                                     ?????     Patient wanted to proceed with surgery. Returned to MD   David P. HHelene KelpPT, MPH 12/04/18 10:01 AM

## 2018-10-03 ENCOUNTER — Encounter: Payer: 59 | Admitting: Physical Therapy

## 2018-10-05 ENCOUNTER — Encounter (INDEPENDENT_AMBULATORY_CARE_PROVIDER_SITE_OTHER): Payer: Self-pay | Admitting: Orthopaedic Surgery

## 2018-10-05 ENCOUNTER — Ambulatory Visit (INDEPENDENT_AMBULATORY_CARE_PROVIDER_SITE_OTHER): Payer: 59 | Admitting: Orthopaedic Surgery

## 2018-10-05 VITALS — BP 132/83 | HR 63 | Ht 76.0 in | Wt 209.0 lb

## 2018-10-05 DIAGNOSIS — M4802 Spinal stenosis, cervical region: Secondary | ICD-10-CM

## 2018-10-05 DIAGNOSIS — M4722 Other spondylosis with radiculopathy, cervical region: Secondary | ICD-10-CM | POA: Diagnosis not present

## 2018-10-09 ENCOUNTER — Encounter (INDEPENDENT_AMBULATORY_CARE_PROVIDER_SITE_OTHER): Payer: Self-pay | Admitting: Orthopaedic Surgery

## 2018-10-09 NOTE — Progress Notes (Signed)
Office Visit Note   Patient: David Salazar           Date of Birth: 1976/09/10           MRN: [id number] Visit Date: 10/05/2018              Requested by: Shirline Frees, NP [address] [place], [place] [address] PCP: Shirline Frees, NP   Assessment & Plan: Visit Diagnoses:  1. Spinal stenosis of cervical region   2. Other spondylosis with radiculopathy, cervical region     Plan: Patient's had greater than 1 year history of persistent neck pain arm pain.  He is failed physical therapy prednisone Dosepak has been taking some tramadol intermittently.  He is been through anti-inflammatories muscle relaxants without relief as well as gabapentin.  He like to proceed with single level cervical fusion at C5-6 for surgical treatment of his cervical stenosis with narrowing down to 7 mm and severe by foraminal stenosis.  We discussed use of a soft collar postoperatively.  He can go to work as long someone else can get him to work since he does not have to do any lifting postoperatively once he is off pain medication we can discuss this 1 week postop.  We discussed risks of surgery including pseudoarthrosis, dysphasia, dysphonia, possible nonhealing of the fusion and need for posterior cervical fusion if his pseudoarthrosis was persistently symptomatic and painful.  Discussion for possible degeneration of other disc in the future was included.  Questions elicited and answered he understands and requests we proceed.  An outpatient procedure with overnight stay.  CPT codes 19147, 20931 and 22845 anticipated with PA surgical assistance.  Follow-Up Instructions: No follow-ups on file.   Orders:  No orders of the defined types were placed in this encounter.  No orders of the defined types were placed in this encounter.     Procedures: No procedures performed   Clinical Data: No additional findings.   Subjective: Chief Complaint  Patient presents with  . Neck - Follow-up     HPI 42 year old male returns with persistent problems with significant neck pain right arm pain with numbness.  He states when he looks up toward the ceiling he has numbness in his arm that radiates down to his fingers.  He has been through physical therapy taken prednisone Dosepak used anti-inflammatories.  He is used tramadol and Tylenol.  MRI scan February 2019 showed focal advanced disc degeneration at C5-6 with moderate central stenosis narrowing down to 7 mm of the canal with cord flattening and severe by foraminal stenosis with C6 nerve root impingement.  Past history of right L5-S1 microdiscectomy 2017 doing well.  Review of Systems 14 point review of systems positive for anxiety, hyperlipidemia, hypertension.  Past history of alcohol abuse now negative.  Previous L5-S1 right microdiscectomy 03/18/2016.  Chronic neck pain with radicular arm symptoms.   Objective: Vital Signs: BP 132/83   Pulse 63   Ht 6\' 4"  (1.93 m)   Wt 209 lb (94.8 kg)   BMI 25.44 kg/m   Physical Exam  Constitutional: He is oriented to person, place, and time. He appears well-developed and well-nourished.  HENT:  Head: Normocephalic and atraumatic.  Eyes: Pupils are equal, round, and reactive to light. EOM are normal.  Neck: No tracheal deviation present. No thyromegaly present.  Cardiovascular: Normal rate.  Pulmonary/Chest: Effort normal. He has no wheezes.  Abdominal: Soft. Bowel sounds are normal.  Neurological: He is alert and oriented to person, place,  and time.  Skin: Skin is warm and dry. Capillary refill takes less than 2 seconds.  Psychiatric: He has a normal mood and affect. His behavior is normal. Judgment and thought content normal.    Ortho Exam patient has well-healed lumbar incision negative straight leg raising.  Normal heel and toe walking.  Severe right brachial plexus tenderness.  Moderate brachial tach plexus tenderness on the left.  Positive Spurling on the right.  Interossei are strong  deltoid is normal no shoulder impingement.  Decreased sensation over the right radial C6 distribution of the hand carpal tunnel exam is negative.  Ulnar 2 fingers have normal sensation.  Ulnar nerve at the elbow median nerve at the wrist and forearm is normal.  He has radicular symptoms with neck extension and also with neck flexion lacking 2 fingerbreadths chin to chest.  Lower extremity hyperreflexia no clonus.  Specialty Comments:  No specialty comments available.  Imaging:CLINICAL DATA:  Pain in the neck, right shoulder, and right arm. Right upper extremity weakness. Numbness in the first through third digits of the right hand.  EXAM: MRI CERVICAL SPINE WITHOUT AND WITH CONTRAST  TECHNIQUE: Multiplanar and multiecho pulse sequences of the cervical spine, to include the craniocervical junction and cervicothoracic junction, were obtained without and with intravenous contrast.  CONTRAST:  19mL MULTIHANCE GADOBENATE DIMEGLUMINE 529 MG/ML IV SOLN  COMPARISON:  None.  FINDINGS: Alignment: Cervical spine straightening. Trace retrolisthesis of C5 on C6.  Vertebrae: No fracture. Mild-to-moderate degenerative endplate edema and enhancement at C5-6. 1 cm enhancing lesion in the C4 vertebral body with faint intrinsic T1 signal favored to represent a hemangioma. Mild diffuse narrowing of the cervical spinal canal on a congenital basis.  Cord: Normal signal.  No abnormal intradural enhancement.  Posterior Fossa, vertebral arteries, paraspinal tissues: Unremarkable.  Disc levels:  C2-3: Negative.  C3-4: Mild disc bulging without stenosis.  C4-5: Negative.  C5-6: Moderate to severe disc space narrowing. Broad-based posterior disc osteophyte complex and infolding of the ligamentum flavum result in moderate spinal stenosis (7 mm AP spinal canal diameter) with mild cord flattening and severe bilateral neural foraminal stenosis. Potential bilateral C6 nerve root  impingement.  C7-T1: Negative.  C7-T1: Small central/left central disc protrusion results in mild spinal stenosis without significant spinal cord mass effect. No neural foraminal stenosis.  IMPRESSION: 1. Focally advanced disc degeneration at C5-6 resulting in moderate spinal and severe bilateral neural foraminal stenosis. 2. Mild spinal stenosis at C7-T1 due to a central disc protrusion.   Electronically Signed   By: Sebastian Ache M.D.   On: 02/11/2018 14:00    PMFS History: Patient Active Problem List   Diagnosis Date Noted  . Other spondylosis with radiculopathy, cervical region 08/29/2018  . Spinal stenosis of cervical region 08/29/2018  . Anxiety and depression 08/10/2017  . S/P lumbar discectomy 03/18/2016  . Hyperlipidemia 10/27/2009  . ALCOHOL ABUSE 10/27/2009  . HYPERTENSION 10/27/2009   Past Medical History:  Diagnosis Date  . Alcohol abuse    Sober for 7 years ( 2017).   . Chronic lower back pain    "hopefull not after this OR today" (03/18/2016)  . PONV (postoperative nausea and vomiting)    "just w/my wisdom teeth" (03/18/2016)    Family History  Problem Relation Age of Onset  . Alcohol abuse Father   . Hyperlipidemia Father   . Diabetes Father     Past Surgical History:  Procedure Laterality Date  . BACK SURGERY    . LUMBAR LAMINECTOMY/DECOMPRESSION MICRODISCECTOMY  N/A 03/18/2016   Procedure: Right L5-S1 Microdiscectomy;  Surgeon: Eldred Manges, MD;  [facility];  Service: Orthopedics;  Laterality: N/A;  . MICRODISCECTOMY LUMBAR Right 03/18/2016   L5-S1  . WISDOM TOOTH EXTRACTION  1992   Social History   Occupational History  . Not on file  Tobacco Use  . Smoking status: Former Smoker    Years: 15.00    Types: E-cigarettes, Cigarettes  . Smokeless tobacco: Never Used  . Tobacco comment: 03/18/2016 "I use an electronic cigarette"  Substance and Sexual Activity  . Alcohol use: No  . Drug use: No  . Sexual activity: Yes

## 2019-03-01 ENCOUNTER — Emergency Department (INDEPENDENT_AMBULATORY_CARE_PROVIDER_SITE_OTHER)
Admission: EM | Admit: 2019-03-01 | Discharge: 2019-03-01 | Disposition: A | Discharge status: Home / Self Care | Payer: 59 | Source: Home / Self Care

## 2019-03-01 ENCOUNTER — Other Ambulatory Visit: Payer: Self-pay

## 2019-03-01 DIAGNOSIS — R062 Wheezing: Secondary | ICD-10-CM

## 2019-03-01 DIAGNOSIS — H65191 Other acute nonsuppurative otitis media, right ear: Secondary | ICD-10-CM

## 2019-03-01 DIAGNOSIS — J0100 Acute maxillary sinusitis, unspecified: Secondary | ICD-10-CM | POA: Diagnosis not present

## 2019-03-01 MED ORDER — IPRATROPIUM BROMIDE 0.06 % NA SOLN: 2.0000 | Freq: Four times a day (QID) | NASAL | 1 refills | Status: DC

## 2019-03-01 MED ORDER — AMOXICILLIN-POT CLAVULANATE 875-125 MG PO TABS: 1.0000 | ORAL_TABLET | Freq: Two times a day (BID) | ORAL | 0 refills | Status: DC

## 2019-03-01 NOTE — ED Provider Notes (Signed)
[facility]    CSN: [id number] Arrival date & time: 03/01/19  1115     History   Chief Complaint Chief Complaint  Patient presents with  . Flu like sxs    HPI David Salazar is a 43 y.o. male.   HPI David Salazar is a 43 y.o. male presenting to UC with c/o 4 days of worsening sinus pain and pressure with sinus congestion, sore throat on the Right side and mildly productive cough. Sinus pain is worse with leaning forward.  He has used OTC cold and flu medication w/o relief.  Associated subjective fever and body aches. He did receive the flu vaccine. No known sick contacts or recent travel.    Past Medical History:  Diagnosis Date  . Alcohol abuse    Sober for 7 years ( 2017).   . Chronic lower back pain    "hopefull not after this OR today" (03/18/2016)  . PONV (postoperative nausea and vomiting)    "just w/my wisdom teeth" (03/18/2016)    Patient Active Problem List   Diagnosis Date Noted  . Other spondylosis with radiculopathy, cervical region 08/29/2018  . Spinal stenosis of cervical region 08/29/2018  . Anxiety and depression 08/10/2017  . S/P lumbar discectomy 03/18/2016  . Hyperlipidemia 10/27/2009  . ALCOHOL ABUSE 10/27/2009  . HYPERTENSION 10/27/2009    Past Surgical History:  Procedure Laterality Date  . BACK SURGERY    . LUMBAR LAMINECTOMY/DECOMPRESSION MICRODISCECTOMY N/A 03/18/2016   Procedure: Right L5-S1 Microdiscectomy;  Surgeon: Eldred Manges, MD;  Location: [facility] OR;  Service: Orthopedics;  Laterality: N/A;  . MICRODISCECTOMY LUMBAR Right 03/18/2016   L5-S1  . WISDOM TOOTH EXTRACTION  1992       Home Medications    Prior to Admission medications   Medication Sig Start Date End Date Taking? Authorizing Provider  DULoxetine (CYMBALTA) 60 MG capsule Take 60 mg by mouth 2 (two) times daily.   Yes [provider]  ALPRAZolam Prudy Feeler) 1 MG tablet  10/01/18   [provider]  amoxicillin-clavulanate (AUGMENTIN)  875-125 MG tablet Take 1 tablet by mouth 2 (two) times daily. One po bid x 7 days 03/01/19   Lurene Shadow, PA-C  gabapentin (NEURONTIN) 300 MG capsule  10/01/18   [provider]  ibuprofen (ADVIL,MOTRIN) 200 MG tablet Take 200 mg by mouth every 6 (six) hours as needed.    [provider]  ipratropium (ATROVENT) 0.06 % nasal spray Place 2 sprays into both nostrils 4 (four) times daily. 03/01/19   Lurene Shadow, PA-C  pregabalin (LYRICA) 100 MG capsule Take one capsule twice daily. Patient not taking: Reported on 09/10/2018 08/29/17   Eldred Manges, MD  sertraline (ZOLOFT) 100 MG tablet Take 1 tablet (100 mg total) by mouth daily. Patient not taking: Reported on 10/05/2018 08/15/18   Shirline Frees, NP  traMADol (ULTRAM) 50 MG tablet Take 1 tablet (50 mg total) by mouth daily as needed for moderate pain. Patient not taking: Reported on 10/05/2018 08/29/18   Eldred Manges, MD    Family History Family History  Problem Relation Age of Onset  . Alcohol abuse Father   . Hyperlipidemia Father   . Diabetes Father     Social History Social History   Tobacco Use  . Smoking status: Former Smoker    Years: 15.00    Types: E-cigarettes, Cigarettes  . Smokeless tobacco: Never Used  . Tobacco comment: 03/18/2016 "I use an electronic cigarette"  Substance Use  Topics  . Alcohol use: No  . Drug use: No     Allergies   Citalopram   Review of Systems Review of Systems  Constitutional: Negative for chills and fever.  HENT: Positive for congestion, sinus pressure and sinus pain. Negative for ear pain, sore throat, trouble swallowing and voice change.   Respiratory: Positive for cough. Negative for shortness of breath.   Cardiovascular: Negative for chest pain and palpitations.  Gastrointestinal: Negative for abdominal pain, diarrhea, nausea and vomiting.  Musculoskeletal: Negative for arthralgias, back pain and myalgias.  Skin: Negative for rash.     Physical Exam Triage Vital  Signs ED Triage Vitals  Enc Vitals Group     BP 03/01/19 1133 125/85     Pulse Rate 03/01/19 1133 88     Resp 03/01/19 1133 18     Temp 03/01/19 1133 97.6 F (36.4 C)     Temp Source 03/01/19 1133 Oral     SpO2 03/01/19 1133 98 %     Weight 03/01/19 1134 218 lb (98.9 kg)     Height 03/01/19 1134 6\' 4"  (1.93 m)     Head Circumference --      Peak Flow --      Pain Score 03/01/19 1134 0     Pain Loc --      Pain Edu? --      Excl. in GC? --    No data found.  Updated Vital Signs BP 125/85 (BP Location: Right Arm)   Pulse 88   Temp 97.6 F (36.4 C) (Oral)   Resp 18   Ht 6\' 4"  (1.93 m)   Wt 218 lb (98.9 kg)   SpO2 98%   BMI 26.54 kg/m   Visual Acuity Right Eye Distance:   Left Eye Distance:   Bilateral Distance:    Right Eye Near:   Left Eye Near:    Bilateral Near:     Physical Exam Vitals signs and nursing note reviewed.  Constitutional:      Appearance: Normal appearance. He is well-developed.  HENT:     Head: Normocephalic and atraumatic.     Right Ear: A middle ear effusion is present. Tympanic membrane is not erythematous.     Left Ear: Tympanic membrane normal.     Nose: Congestion present.     Right Sinus: Maxillary sinus tenderness present. No frontal sinus tenderness.     Left Sinus: Maxillary sinus tenderness present. No frontal sinus tenderness.     Mouth/Throat:     Lips: Pink.     Mouth: Mucous membranes are moist.     Pharynx: Oropharynx is clear. Uvula midline.  Neck:     Musculoskeletal: Normal range of motion and neck supple.  Cardiovascular:     Rate and Rhythm: Normal rate and regular rhythm.  Pulmonary:     Effort: Pulmonary effort is normal. No respiratory distress.     Breath sounds: No stridor. Wheezing (upper lung fields) present. No rhonchi.  Musculoskeletal: Normal range of motion.  Lymphadenopathy:     Cervical: No cervical adenopathy.  Skin:    General: Skin is warm and dry.  Neurological:     Mental Status: He is alert  and oriented to person, place, and time.  Psychiatric:        Behavior: Behavior normal.      UC Treatments / Results  Labs (all labs ordered are listed, but only abnormal results are displayed) Labs Reviewed - No data to display  EKG  None  Radiology No results found.  Procedures Procedures (including critical care time)  Medications Ordered in UC Medications - No data to display  Initial Impression / Assessment and Plan / UC Course  I have reviewed the triage vital signs and the nursing notes.  Pertinent labs & imaging results that were available during my care of the patient were reviewed by me and considered in my medical decision making (see chart for details).     Hx and exam c/w maxillary sinusitis secondary to URI There is a slight wheeze on exam. Pr denies SOB. No hx asthma or need for inhaler in the past.  O2 Sat 98% on RA Will tx with augmentin  Final Clinical Impressions(s) / UC Diagnoses   Final diagnoses:  Acute non-recurrent maxillary sinusitis  Acute MEE (middle ear effusion), right  Wheeze     Discharge Instructions      You may take  acetaminophen every 4-6 hours or in combination with ibuprofen 400-600mg  every 6-8 hours as needed for pain, inflammation, and fever.  Be sure to well hydrated with clear liquids and get at least 8 hours of sleep at night, preferably more while sick.   Please follow up with family medicine in 1 week if needed.     ED Prescriptions    Medication Sig Dispense Auth. Provider   amoxicillin-clavulanate (AUGMENTIN) 875-125 MG tablet Take 1 tablet by mouth 2 (two) times daily. One po bid x 7 days 14 tablet Sherilynn Dieu O, PA-C   ipratropium (ATROVENT) 0.06 % nasal spray Place 2 sprays into both nostrils 4 (four) times daily. 15 mL Lurene Shadow, PA-C     Controlled Substance Prescriptions [place] Controlled Substance Registry consulted? Not Applicable   Rolla Plate 03/01/19 1146

## 2019-03-01 NOTE — Discharge Instructions (Signed)
  You may take 500mg acetaminophen every 4-6 hours or in combination with ibuprofen 400-600mg every 6-8 hours as needed for pain, inflammation, and fever.  Be sure to well hydrated with clear liquids and get at least 8 hours of sleep at night, preferably more while sick.   Please follow up with family medicine in 1 week if needed.   

## 2019-03-01 NOTE — ED Triage Notes (Signed)
Pt c/o flu like sxs since Tues evening. Sinus pressure, sore throat today and cough. Taking OTC cold and flu prn.

## 2019-12-26 ENCOUNTER — Encounter

## 2020-01-09 ENCOUNTER — Encounter: Payer: 59 | Admitting: Adult Health

## 2020-04-03 ENCOUNTER — Ambulatory Visit: Payer: Self-pay | Attending: Internal Medicine

## 2020-04-03 DIAGNOSIS — Z23 Encounter for immunization: Secondary | ICD-10-CM

## 2020-04-03 NOTE — Progress Notes (Signed)
   Covid-19 Vaccination Clinic  Name:  David Salazar    MRN: [id number] DOB: [birth year]  04/03/2020  Mr. Bean was observed post Covid-19 immunization for 15 minutes without incident. He was provided with Vaccine Information Sheet and instruction to access the V-Safe system.   [name] was instructed to call 911 with any severe reactions post vaccine: Marland Kitchen Difficulty breathing  . Swelling of face and throat  . A fast heartbeat  . A bad rash all over body  . Dizziness and weakness   Immunizations Administered    Name Date Dose VIS Date Route   Pfizer COVID-19 Vaccine 04/03/2020  9:57 AM 0.3 mL 12/06/2019 Intramuscular   Manufacturer: [employer], [employer]   Lot: [id number]   NDC: [address]-7

## 2020-04-28 ENCOUNTER — Ambulatory Visit: Payer: Self-pay | Attending: Internal Medicine

## 2020-04-28 DIAGNOSIS — Z23 Encounter for immunization: Secondary | ICD-10-CM

## 2020-04-28 NOTE — Progress Notes (Signed)
   Covid-19 Vaccination Clinic  Name:  David Salazar    MRN: [id number] DOB: Jun 25, 1976  04/28/2020  David Salazar was observed post Covid-19 immunization for 15 minutes without incident. He was provided with Vaccine Information Sheet and instruction to access the V-Safe system.   David Salazar was instructed to call 911 with any severe reactions post vaccine: Marland Kitchen Difficulty breathing  . Swelling of face and throat  . A fast heartbeat  . A bad rash all over body  . Dizziness and weakness   Immunizations Administered    Name Date Dose VIS Date Route   Pfizer COVID-19 Vaccine 04/28/2020  8:22 AM 0.3 mL 02/19/2019 Intramuscular   Manufacturer: [employer], [employer]   Lot: [id number]   NDC: [address]-3

## 2020-05-04 DIAGNOSIS — F4321 Adjustment disorder with depressed mood: Secondary | ICD-10-CM | POA: Diagnosis not present

## 2020-05-04 DIAGNOSIS — F1011 Alcohol abuse, in remission: Secondary | ICD-10-CM | POA: Diagnosis not present

## 2020-05-04 DIAGNOSIS — F410 Panic disorder [episodic paroxysmal anxiety] without agoraphobia: Secondary | ICD-10-CM | POA: Diagnosis not present

## 2020-05-20 ENCOUNTER — Encounter: Payer: Self-pay | Admitting: Adult Health

## 2020-05-20 ENCOUNTER — Ambulatory Visit (INDEPENDENT_AMBULATORY_CARE_PROVIDER_SITE_OTHER): Payer: BC Managed Care – PPO | Admitting: Adult Health

## 2020-05-20 ENCOUNTER — Other Ambulatory Visit: Payer: Self-pay

## 2020-05-20 VITALS — BP 110/80 | Temp 97.6°F | Ht 76.0 in | Wt 228.0 lb

## 2020-05-20 DIAGNOSIS — Z125 Encounter for screening for malignant neoplasm of prostate: Secondary | ICD-10-CM | POA: Diagnosis not present

## 2020-05-20 DIAGNOSIS — F419 Anxiety disorder, unspecified: Secondary | ICD-10-CM | POA: Diagnosis not present

## 2020-05-20 DIAGNOSIS — Z0000 Encounter for general adult medical examination without abnormal findings: Secondary | ICD-10-CM

## 2020-05-20 DIAGNOSIS — F329 Major depressive disorder, single episode, unspecified: Secondary | ICD-10-CM

## 2020-05-20 DIAGNOSIS — F32A Depression, unspecified: Secondary | ICD-10-CM

## 2020-05-20 LAB — CBC WITH DIFFERENTIAL/PLATELET
Basophils Absolute: 0 10*3/uL (ref 0.0–0.1)
Basophils Relative: 0.8 % (ref 0.0–3.0)
Eosinophils Absolute: 0.2 10*3/uL (ref 0.0–0.7)
Eosinophils Relative: 2.9 % (ref 0.0–5.0)
HCT: 43.8 % (ref 39.0–52.0)
Hemoglobin: 15 g/dL (ref 13.0–17.0)
Lymphocytes Relative: 37.8 % (ref 12.0–46.0)
Lymphs Abs: 2 10*3/uL (ref 0.7–4.0)
MCHC: 34.2 g/dL (ref 30.0–36.0)
MCV: 93.2 fl (ref 78.0–100.0)
Monocytes Absolute: 0.4 10*3/uL (ref 0.1–1.0)
Monocytes Relative: 7.8 % (ref 3.0–12.0)
Neutro Abs: 2.7 10*3/uL (ref 1.4–7.7)
Neutrophils Relative %: 50.7 % (ref 43.0–77.0)
Platelets: 231 10*3/uL (ref 150.0–400.0)
RBC: 4.7 Mil/uL (ref 4.22–5.81)
RDW: 13.2 % (ref 11.5–15.5)
WBC: 5.3 10*3/uL (ref 4.0–10.5)

## 2020-05-20 LAB — COMPREHENSIVE METABOLIC PANEL
ALT: 13 U/L (ref 0–53)
AST: 16 U/L (ref 0–37)
Albumin: 4.5 g/dL (ref 3.5–5.2)
Alkaline Phosphatase: 79 U/L (ref 39–117)
BUN: 17 mg/dL (ref 6–23)
CO2: 32 mEq/L (ref 19–32)
Calcium: 9.5 mg/dL (ref 8.4–10.5)
Chloride: 103 mEq/L (ref 96–112)
Creatinine, Ser: 1.14 mg/dL (ref 0.40–1.50)
GFR: 69.08 mL/min (ref >60.00)
Glucose, Bld: 88 mg/dL (ref 70–99)
Potassium: 4.4 mEq/L (ref 3.5–5.1)
Sodium: 140 mEq/L (ref 135–145)
Total Bilirubin: 0.6 mg/dL (ref 0.2–1.2)
Total Protein: 6.9 g/dL (ref 6.0–8.3)

## 2020-05-20 LAB — LIPID PANEL
Cholesterol: 231 mg/dL — ABNORMAL HIGH (ref 0–200)
HDL: 57.7 mg/dL (ref >39.00)
LDL Cholesterol: 158 mg/dL — ABNORMAL HIGH (ref 0–99)
NonHDL: 173.52
Total CHOL/HDL Ratio: 4
Triglycerides: 80 mg/dL (ref 0.0–149.0)
VLDL: 16 mg/dL (ref 0.0–40.0)

## 2020-05-20 LAB — TSH: TSH: 0.78 u[IU]/mL (ref 0.35–4.50)

## 2020-05-20 LAB — PSA: PSA: 1.01 ng/mL (ref 0.10–4.00)

## 2020-05-20 NOTE — Patient Instructions (Signed)
It was great seeing you today   We will follow up with you regarding your blood work   Please work on diet and exercise to help lose weight  

## 2020-05-20 NOTE — Progress Notes (Signed)
Subjective:    Patient ID: David Salazar, male    DOB: Mar 13, 1976, 44 y.o.   MRN: [id number]  HPI Patient presents for yearly preventative medicine examination. He is a pleasant 44 year old male who  has a past medical history of Alcohol abuse, Chronic lower back pain, and PONV (postoperative nausea and vomiting).   He was last seen in the office in 2019 and his last CPE was in 2017  Anxiety and Depression - is managed by psychiatry. Is currently prescribed Abilify 2 mg, Cymbalta 60 mg, and xanax 1 mg PRN. He feels well controlled on this therapy. He also stated a new job that is less stressful.   All immunizations and health maintenance protocols were reviewed with the patient and needed orders were placed.  Appropriate screening laboratory values were ordered for the patient including screening of hyperlipidemia, renal function and hepatic function. If indicated by BPH, a PSA was ordered.  Medication reconciliation,  past medical history, social history, problem list and allergies were reviewed in detail with the patient  Goals were established with regard to weight loss, exercise, and  diet in compliance with medications. He is not exercising yet, but plans to do so. He is eating healthy.  Wt Readings from Last 3 Encounters:  05/20/20 228 lb (103.4 kg)  03/01/19 218 lb (98.9 kg)  10/05/18 209 lb (94.8 kg)   He has no acute issues.   Review of Systems  Constitutional: Negative.   HENT: Negative.   Eyes: Negative.   Respiratory: Negative.   Cardiovascular: Negative.   Gastrointestinal: Negative.   Endocrine: Negative.   Genitourinary: Negative.   Musculoskeletal: Negative.   Skin: Negative.   Allergic/Immunologic: Negative.   Neurological: Negative.   Hematological: Negative.   Psychiatric/Behavioral: Negative.   All other systems reviewed and are negative.  Past Medical History:  Diagnosis Date  . Alcohol abuse    Sober for 7 years ( 2017).   . Chronic lower  back pain    "hopefull not after this OR today" (03/18/2016)  . PONV (postoperative nausea and vomiting)    "just w/my wisdom teeth" (03/18/2016)    Social History   Socioeconomic History  . Marital status: Married    Spouse name: Not on file  . Number of children: Not on file  . Years of education: Not on file  . Highest education level: Not on file  Occupational History  . Not on file  Tobacco Use  . Smoking status: Former Smoker    Years: 15.00    Types: E-cigarettes, Cigarettes  . Smokeless tobacco: Never Used  . Tobacco comment: 03/18/2016 "I use an electronic cigarette"  Substance and Sexual Activity  . Alcohol use: No  . Drug use: No  . Sexual activity: Yes  Other Topics Concern  . Not on file  Social History Narrative  . Not on file   Social Determinants of Health   Financial Resource Strain:   . Difficulty of Paying Living Expenses:   Food Insecurity:   . Worried About Programme researcher, broadcasting/film/video in the Last Year:   . Barista in the Last Year:   Transportation Needs:   . Freight forwarder (Medical):   Marland Kitchen Lack of Transportation (Non-Medical):   Physical Activity:   . Days of Exercise per Week:   . Minutes of Exercise per Session:   Stress:   . Feeling of Stress :   Social Connections:   . Frequency of Communication  with Friends and Family:   . Frequency of Social Gatherings with Friends and Family:   . Attends Religious Services:   . Active Member of Clubs or Organizations:   . Attends Banker Meetings:   Marland Kitchen Marital Status:   Intimate Partner Violence:   . Fear of Current or Ex-Partner:   . Emotionally Abused:   Marland Kitchen Physically Abused:   . Sexually Abused:     Past Surgical History:  Procedure Laterality Date  . BACK SURGERY    . LUMBAR LAMINECTOMY/DECOMPRESSION MICRODISCECTOMY N/A 03/18/2016   Procedure: Right L5-S1 Microdiscectomy;  Surgeon: Eldred Manges, MD;  Location: [facility] OR;  Service: Orthopedics;  Laterality: N/A;  .  MICRODISCECTOMY LUMBAR Right 03/18/2016   L5-S1  . WISDOM TOOTH EXTRACTION  1992    Family History  Problem Relation Age of Onset  . Alcohol abuse Father   . Hyperlipidemia Father   . Diabetes Father     Allergies  Allergen Reactions  . Citalopram Anxiety    Abdominal Pain     Current Outpatient Medications on File Prior to Visit  Medication Sig Dispense Refill  . ALPRAZolam (XANAX) 1 MG tablet     . ARIPiprazole (ABILIFY) 2 MG tablet Take 4 mg by mouth every morning.    . DULoxetine (CYMBALTA) 60 MG capsule Take 120 mg by mouth daily.    Marland Kitchen ibuprofen (ADVIL,MOTRIN) 200 MG tablet Take 200 mg by mouth every 6 (six) hours as needed.     No current facility-administered medications on file prior to visit.    BP 110/80   Temp 97.6 F (36.4 C)   Ht 6\' 4"  (1.93 m)   Wt 228 lb (103.4 kg)   BMI 27.75 kg/m       Objective:   Physical Exam Vitals and nursing note reviewed.  Constitutional:      General: He is not in acute distress.    Appearance: Normal appearance. He is well-developed and normal weight.  HENT:     Head: Normocephalic and atraumatic.     Right Ear: Tympanic membrane, ear canal and external ear normal. There is no impacted cerumen.     Left Ear: Tympanic membrane, ear canal and external ear normal. There is no impacted cerumen.     Nose: Nose normal. No congestion or rhinorrhea.     Mouth/Throat:     Mouth: Mucous membranes are moist.     Pharynx: Oropharynx is clear. No oropharyngeal exudate or posterior oropharyngeal erythema.  Eyes:     General:        Right eye: No discharge.        Left eye: No discharge.     Extraocular Movements: Extraocular movements intact.     Conjunctiva/sclera: Conjunctivae normal.     Pupils: Pupils are equal, round, and reactive to light.  Neck:     Vascular: No carotid bruit.     Trachea: No tracheal deviation.  Cardiovascular:     Rate and Rhythm: Normal rate and regular rhythm.     Pulses: Normal pulses.     Heart  sounds: Normal heart sounds. No murmur. No friction rub. No gallop.   Pulmonary:     Effort: Pulmonary effort is normal. No respiratory distress.     Breath sounds: Normal breath sounds. No stridor. No wheezing, rhonchi or rales.  Chest:     Chest wall: No tenderness.  Abdominal:     General: Bowel sounds are normal. There is no distension.  Palpations: Abdomen is soft. There is no mass.     Tenderness: There is no abdominal tenderness. There is no right CVA tenderness, left CVA tenderness, guarding or rebound.     Hernia: No hernia is present.  Musculoskeletal:        General: No swelling, tenderness, deformity or signs of injury. Normal range of motion.     Right lower leg: No edema.     Left lower leg: No edema.  Lymphadenopathy:     Cervical: No cervical adenopathy.  Skin:    General: Skin is warm and dry.     Capillary Refill: Capillary refill takes less than 2 seconds.     Coloration: Skin is not jaundiced or pale.     Findings: No bruising, erythema, lesion or rash.  Neurological:     General: No focal deficit present.     Mental Status: He is alert and oriented to person, place, and time.     Cranial Nerves: No cranial nerve deficit.     Sensory: No sensory deficit.     Motor: No weakness.     Coordination: Coordination normal.     Gait: Gait normal.     Deep Tendon Reflexes: Reflexes normal.  Psychiatric:        Mood and Affect: Mood normal.        Behavior: Behavior normal.        Thought Content: Thought content normal.        Judgment: Judgment normal.       Assessment & Plan:  1. Routine general medical examination at a health care facility - Needs to work on weight loss through diet and exercise - Follow up in one year or sooner if needed - May need to consider statin due to being on psych meds that increase cholesterol levels.  - CBC with Differential/Platelet - Comprehensive metabolic panel - Lipid panel - TSH  2. Anxiety and depression - Well  controlled with current medication  - Follow up with psychiatry as directed  3. Prostate cancer screening  - PSA   Dorothyann Peng, NP

## 2020-05-22 ENCOUNTER — Other Ambulatory Visit: Payer: Self-pay | Admitting: *Deleted

## 2020-05-22 ENCOUNTER — Telehealth: Payer: Self-pay | Admitting: Adult Health

## 2020-05-22 MED ORDER — ROSUVASTATIN CALCIUM 5 MG PO TABS: 5.0000 mg | ORAL_TABLET | Freq: Every day | ORAL | 3 refills | Status: DC

## 2020-05-22 NOTE — Telephone Encounter (Signed)
Returned patient call. No answer. LVM for patient to return call

## 2020-05-22 NOTE — Telephone Encounter (Signed)
The patient was returning Misty's call for lab results.

## 2020-05-26 ENCOUNTER — Other Ambulatory Visit: Payer: Self-pay | Admitting: *Deleted

## 2020-05-26 NOTE — Telephone Encounter (Signed)
Jobe Gibbon, CMA  05/22/2020 3:51 PM EDT    Result note read to patient. Patient verbalized understanding. Rx for Crestor sent to the pharmacy.

## 2020-07-13 DIAGNOSIS — F410 Panic disorder [episodic paroxysmal anxiety] without agoraphobia: Secondary | ICD-10-CM | POA: Diagnosis not present

## 2020-07-13 DIAGNOSIS — F1011 Alcohol abuse, in remission: Secondary | ICD-10-CM | POA: Diagnosis not present

## 2020-07-13 DIAGNOSIS — F4321 Adjustment disorder with depressed mood: Secondary | ICD-10-CM | POA: Diagnosis not present

## 2020-08-12 DIAGNOSIS — F1011 Alcohol abuse, in remission: Secondary | ICD-10-CM | POA: Diagnosis not present

## 2020-08-12 DIAGNOSIS — F4321 Adjustment disorder with depressed mood: Secondary | ICD-10-CM | POA: Diagnosis not present

## 2020-08-12 DIAGNOSIS — F410 Panic disorder [episodic paroxysmal anxiety] without agoraphobia: Secondary | ICD-10-CM | POA: Diagnosis not present

## 2020-10-05 DIAGNOSIS — F410 Panic disorder [episodic paroxysmal anxiety] without agoraphobia: Secondary | ICD-10-CM | POA: Diagnosis not present

## 2020-10-05 DIAGNOSIS — F1011 Alcohol abuse, in remission: Secondary | ICD-10-CM | POA: Diagnosis not present

## 2020-10-05 DIAGNOSIS — F4321 Adjustment disorder with depressed mood: Secondary | ICD-10-CM | POA: Diagnosis not present

## 2020-10-09 DIAGNOSIS — F1011 Alcohol abuse, in remission: Secondary | ICD-10-CM | POA: Diagnosis not present

## 2020-10-09 DIAGNOSIS — F410 Panic disorder [episodic paroxysmal anxiety] without agoraphobia: Secondary | ICD-10-CM | POA: Diagnosis not present

## 2020-10-09 DIAGNOSIS — F4321 Adjustment disorder with depressed mood: Secondary | ICD-10-CM | POA: Diagnosis not present

## 2020-10-16 DIAGNOSIS — F410 Panic disorder [episodic paroxysmal anxiety] without agoraphobia: Secondary | ICD-10-CM | POA: Diagnosis not present

## 2020-10-16 DIAGNOSIS — F4321 Adjustment disorder with depressed mood: Secondary | ICD-10-CM | POA: Diagnosis not present

## 2020-10-16 DIAGNOSIS — F1011 Alcohol abuse, in remission: Secondary | ICD-10-CM | POA: Diagnosis not present

## 2020-12-04 DIAGNOSIS — F410 Panic disorder [episodic paroxysmal anxiety] without agoraphobia: Secondary | ICD-10-CM | POA: Diagnosis not present

## 2020-12-04 DIAGNOSIS — F4321 Adjustment disorder with depressed mood: Secondary | ICD-10-CM | POA: Diagnosis not present

## 2020-12-04 DIAGNOSIS — F1011 Alcohol abuse, in remission: Secondary | ICD-10-CM | POA: Diagnosis not present

## 2020-12-11 DIAGNOSIS — F1011 Alcohol abuse, in remission: Secondary | ICD-10-CM | POA: Diagnosis not present

## 2020-12-11 DIAGNOSIS — F4321 Adjustment disorder with depressed mood: Secondary | ICD-10-CM | POA: Diagnosis not present

## 2020-12-11 DIAGNOSIS — F410 Panic disorder [episodic paroxysmal anxiety] without agoraphobia: Secondary | ICD-10-CM | POA: Diagnosis not present

## 2021-01-01 DIAGNOSIS — Z20828 Contact with and (suspected) exposure to other viral communicable diseases: Secondary | ICD-10-CM | POA: Diagnosis not present

## 2021-01-04 DIAGNOSIS — F4321 Adjustment disorder with depressed mood: Secondary | ICD-10-CM | POA: Diagnosis not present

## 2021-01-04 DIAGNOSIS — F1011 Alcohol abuse, in remission: Secondary | ICD-10-CM | POA: Diagnosis not present

## 2021-01-04 DIAGNOSIS — F410 Panic disorder [episodic paroxysmal anxiety] without agoraphobia: Secondary | ICD-10-CM | POA: Diagnosis not present

## 2021-01-15 DIAGNOSIS — F1011 Alcohol abuse, in remission: Secondary | ICD-10-CM | POA: Diagnosis not present

## 2021-01-15 DIAGNOSIS — F410 Panic disorder [episodic paroxysmal anxiety] without agoraphobia: Secondary | ICD-10-CM | POA: Diagnosis not present

## 2021-01-15 DIAGNOSIS — F4321 Adjustment disorder with depressed mood: Secondary | ICD-10-CM | POA: Diagnosis not present

## 2021-02-03 DIAGNOSIS — F1011 Alcohol abuse, in remission: Secondary | ICD-10-CM | POA: Diagnosis not present

## 2021-02-03 DIAGNOSIS — F4321 Adjustment disorder with depressed mood: Secondary | ICD-10-CM | POA: Diagnosis not present

## 2021-02-03 DIAGNOSIS — F410 Panic disorder [episodic paroxysmal anxiety] without agoraphobia: Secondary | ICD-10-CM | POA: Diagnosis not present

## 2021-02-19 DIAGNOSIS — F410 Panic disorder [episodic paroxysmal anxiety] without agoraphobia: Secondary | ICD-10-CM | POA: Diagnosis not present

## 2021-02-19 DIAGNOSIS — F1011 Alcohol abuse, in remission: Secondary | ICD-10-CM | POA: Diagnosis not present

## 2021-02-19 DIAGNOSIS — F4321 Adjustment disorder with depressed mood: Secondary | ICD-10-CM | POA: Diagnosis not present

## 2021-03-04 DIAGNOSIS — F410 Panic disorder [episodic paroxysmal anxiety] without agoraphobia: Secondary | ICD-10-CM | POA: Diagnosis not present

## 2021-03-04 DIAGNOSIS — F1011 Alcohol abuse, in remission: Secondary | ICD-10-CM | POA: Diagnosis not present

## 2021-03-04 DIAGNOSIS — F4321 Adjustment disorder with depressed mood: Secondary | ICD-10-CM | POA: Diagnosis not present

## 2021-03-12 DIAGNOSIS — F4321 Adjustment disorder with depressed mood: Secondary | ICD-10-CM | POA: Diagnosis not present

## 2021-03-12 DIAGNOSIS — F410 Panic disorder [episodic paroxysmal anxiety] without agoraphobia: Secondary | ICD-10-CM | POA: Diagnosis not present

## 2021-03-12 DIAGNOSIS — F1011 Alcohol abuse, in remission: Secondary | ICD-10-CM | POA: Diagnosis not present

## 2021-04-30 DIAGNOSIS — F4321 Adjustment disorder with depressed mood: Secondary | ICD-10-CM | POA: Diagnosis not present

## 2021-04-30 DIAGNOSIS — F1011 Alcohol abuse, in remission: Secondary | ICD-10-CM | POA: Diagnosis not present

## 2021-04-30 DIAGNOSIS — F410 Panic disorder [episodic paroxysmal anxiety] without agoraphobia: Secondary | ICD-10-CM | POA: Diagnosis not present

## 2021-05-27 DIAGNOSIS — F1011 Alcohol abuse, in remission: Secondary | ICD-10-CM | POA: Diagnosis not present

## 2021-05-27 DIAGNOSIS — F4321 Adjustment disorder with depressed mood: Secondary | ICD-10-CM | POA: Diagnosis not present

## 2021-05-27 DIAGNOSIS — F410 Panic disorder [episodic paroxysmal anxiety] without agoraphobia: Secondary | ICD-10-CM | POA: Diagnosis not present

## 2021-05-28 DIAGNOSIS — F1011 Alcohol abuse, in remission: Secondary | ICD-10-CM | POA: Diagnosis not present

## 2021-05-28 DIAGNOSIS — F4321 Adjustment disorder with depressed mood: Secondary | ICD-10-CM | POA: Diagnosis not present

## 2021-05-28 DIAGNOSIS — F410 Panic disorder [episodic paroxysmal anxiety] without agoraphobia: Secondary | ICD-10-CM | POA: Diagnosis not present

## 2021-06-18 DIAGNOSIS — F4321 Adjustment disorder with depressed mood: Secondary | ICD-10-CM | POA: Diagnosis not present

## 2021-06-18 DIAGNOSIS — F1011 Alcohol abuse, in remission: Secondary | ICD-10-CM | POA: Diagnosis not present

## 2021-06-18 DIAGNOSIS — F410 Panic disorder [episodic paroxysmal anxiety] without agoraphobia: Secondary | ICD-10-CM | POA: Diagnosis not present

## 2021-07-14 ENCOUNTER — Encounter: Payer: BC Managed Care – PPO | Admitting: Adult Health

## 2021-08-25 ENCOUNTER — Other Ambulatory Visit: Payer: Self-pay

## 2021-08-25 ENCOUNTER — Encounter (HOSPITAL_COMMUNITY): Payer: Self-pay

## 2021-08-25 ENCOUNTER — Other Ambulatory Visit (HOSPITAL_BASED_OUTPATIENT_CLINIC_OR_DEPARTMENT_OTHER): Payer: Self-pay

## 2021-08-25 ENCOUNTER — Ambulatory Visit (HOSPITAL_COMMUNITY)
Admission: EM | Admit: 2021-08-25 | Discharge: 2021-08-25 | Disposition: A | Discharge status: Home / Self Care | Payer: BC Managed Care – PPO | Attending: Family Medicine | Admitting: Family Medicine

## 2021-08-25 DIAGNOSIS — M5431 Sciatica, right side: Secondary | ICD-10-CM | POA: Diagnosis not present

## 2021-08-25 MED ORDER — CYCLOBENZAPRINE HCL 10 MG PO TABS: 10.0000 mg | ORAL_TABLET | Freq: Three times a day (TID) | ORAL | 0 refills | Status: DC | PRN

## 2021-08-25 MED ORDER — PREDNISONE 10 MG PO TABS: ORAL_TABLET | ORAL | 0 refills | Status: DC

## 2021-08-25 NOTE — ED Provider Notes (Signed)
[facility]    CSN: [id number] Arrival date & time: 08/25/21  1825      History   Chief Complaint Chief Complaint  Patient presents with   Back Pain    HPI David Salazar is a 45 y.o. male.   Patient presenting today with 1 week history of right-sided lateral low back pain radiating down the upper right leg.  He states there was no known injury, heavy lifting episode prior to initiation of symptoms.  Some numbness and tingling to the upper leg, no weakness, bowel or bladder changes, fever, chills.  Does have a history of herniated disks followed by orthopedics, status post laminectomy and discectomy.  So far taking ibuprofen and Tylenol alternating with minimal relief.  Past Medical History:  Diagnosis Date   Alcohol abuse    Sober for 7 years ( 2017).    Chronic lower back pain    "hopefull not after this OR today" (03/18/2016)   PONV (postoperative nausea and vomiting)    "just w/my wisdom teeth" (03/18/2016)    Patient Active Problem List   Diagnosis Date Noted   Other spondylosis with radiculopathy, cervical region 08/29/2018   Spinal stenosis of cervical region 08/29/2018   Anxiety and depression 08/10/2017   S/P lumbar discectomy 03/18/2016   Hyperlipidemia 10/27/2009   ALCOHOL ABUSE 10/27/2009   HYPERTENSION 10/27/2009    Past Surgical History:  Procedure Laterality Date   BACK SURGERY     LUMBAR LAMINECTOMY/DECOMPRESSION MICRODISCECTOMY N/A 03/18/2016   Procedure: Right L5-S1 Microdiscectomy;  Surgeon: Eldred Manges, MD;  Location: [facility] OR;  Service: Orthopedics;  Laterality: N/A;   MICRODISCECTOMY LUMBAR Right 03/18/2016   L5-S1   WISDOM TOOTH EXTRACTION  1992       Home Medications    Prior to Admission medications   Medication Sig Start Date End Date Taking? Authorizing Provider  cyclobenzaprine (FLEXERIL) 10 MG tablet Take 1 tablet (10 mg total) by mouth 3 (three) times daily as needed for muscle spasms. Do not drink alcohol or drive  while taking this medication.  May cause drowsiness. 08/25/21  Yes Particia Nearing, PA-C  predniSONE (DELTASONE) 10 MG tablet Take 6 tabs day one, 5 tabs day two, 4 tabs day three, etc 08/25/21  Yes Particia Nearing, PA-C  ALPRAZolam Prudy Feeler) 1 MG tablet  10/01/18   [provider]  ARIPiprazole (ABILIFY) 2 MG tablet Take 4 mg by mouth every morning. 05/15/20   [provider]  buPROPion (WELLBUTRIN XL) 300 MG 24 hr tablet Take 300 mg by mouth every morning. 05/28/21   [provider]  DULoxetine (CYMBALTA) 60 MG capsule Take 120 mg by mouth daily.    [provider]  ibuprofen (ADVIL,MOTRIN) 200 MG tablet Take 200 mg by mouth every 6 (six) hours as needed.    [provider]  propranolol (INDERAL) 10 MG tablet SMARTSIG:1 Tablet(s) By Mouth 1-2 Times Daily 05/27/21   [provider]  rosuvastatin (CRESTOR) 5 MG tablet Take 1 tablet (5 mg total) by mouth daily. 05/22/20   Nafziger, Kandee Keen, NP  traZODone (DESYREL) 50 MG tablet Take 50-100 mg by mouth at bedtime. 05/27/21   [provider]    Family History Family History  Problem Relation Age of Onset   Alcohol abuse Father    Hyperlipidemia Father    Diabetes Father     Social History Social History   Tobacco Use   Smoking status: Former    Years: 15.00  Types: E-cigarettes, Cigarettes   Smokeless tobacco: Never   Tobacco comments:    03/18/2016 "I use an electronic cigarette"  Vaping Use   Vaping Use: Never used  Substance Use Topics   Alcohol use: No   Drug use: No     Allergies   Citalopram   Review of Systems Review of Systems Per HPI  Physical Exam Triage Vital Signs ED Triage Vitals  Enc Vitals Group     BP 08/25/21 1940 (!) 133/91     Pulse Rate 08/25/21 1940 75     Resp 08/25/21 1940 18     Temp 08/25/21 1940 99.4 F (37.4 C)     Temp Source 08/25/21 1940 Oral     SpO2 08/25/21 1940 96 %     Weight --      Height --      Head Circumference  --      Peak Flow --      Pain Score 08/25/21 1939 7     Pain Loc --      Pain Edu? --      Excl. in GC? --    No data found.  Updated Vital Signs BP (!) 133/91 (BP Location: Right Arm)   Pulse 75   Temp 99.4 F (37.4 C) (Oral)   Resp 18   SpO2 96%   Visual Acuity Right Eye Distance:   Left Eye Distance:   Bilateral Distance:    Right Eye Near:   Left Eye Near:    Bilateral Near:     Physical Exam Vitals and nursing note reviewed.  Constitutional:      Appearance: Normal appearance.  HENT:     Head: Atraumatic.  Eyes:     Extraocular Movements: Extraocular movements intact.     Conjunctiva/sclera: Conjunctivae normal.  Cardiovascular:     Rate and Rhythm: Normal rate and regular rhythm.  Pulmonary:     Effort: Pulmonary effort is normal.     Breath sounds: Normal breath sounds.  Musculoskeletal:        General: Tenderness present. No swelling. Normal range of motion.     Cervical back: Normal range of motion and neck supple.     Comments: Right lateral lumbar musculature tender to palpation.  Positive straight leg raise right lower extremity.  Minimally antalgic gait.  Good range of motion diffusely.  No midline spinal tenderness palpation or deformity  Skin:    General: Skin is warm and dry.     Findings: No erythema.  Neurological:     General: No focal deficit present.     Mental Status: He is oriented to person, place, and time.     Sensory: No sensory deficit.     Motor: No weakness.     Gait: Gait normal.  Psychiatric:        Mood and Affect: Mood normal.        Thought Content: Thought content normal.        Judgment: Judgment normal.     UC Treatments / Results  Labs (all labs ordered are listed, but only abnormal results are displayed) Labs Reviewed - No data to display  EKG   Radiology No results found.  Procedures Procedures (including critical care time)  Medications Ordered in UC Medications - No data to display  Initial  Impression / Assessment and Plan / UC Course  I have reviewed the triage vital signs and the nursing notes.  Pertinent labs & imaging results that were available  during my care of the patient were reviewed by me and considered in my medical decision making (see chart for details).     Given lack of traumatic injury and bony tenderness, x-ray imaging deferred today with shared decision making.  We will treat with prednisone taper, Flexeril, stretches, Epsom salt soaks, rest.  Has orthopedist from past issues that he will follow-up with if this does not resolve symptoms.  Strict return precautions given for acutely worsening symptoms.  Final Clinical Impressions(s) / UC Diagnoses   Final diagnoses:  Right sided sciatica   Discharge Instructions   None    ED Prescriptions     Medication Sig Dispense Auth. Provider   predniSONE (DELTASONE) 10 MG tablet Take 6 tabs day one, 5 tabs day two, 4 tabs day three, etc 21 tablet Particia Nearing, PA-C   cyclobenzaprine (FLEXERIL) 10 MG tablet Take 1 tablet (10 mg total) by mouth 3 (three) times daily as needed for muscle spasms. Do not drink alcohol or drive while taking this medication.  May cause drowsiness. 15 tablet Particia Nearing, [place]      PDMP not reviewed this encounter.   Particia Nearing, [place] 08/25/21 2042

## 2021-08-25 NOTE — ED Triage Notes (Signed)
Pt reports lower back pain radiates to right leg x 1 week. Tylenol and ibuprofen gives some relief.

## 2021-08-31 ENCOUNTER — Other Ambulatory Visit: Payer: Self-pay

## 2021-08-31 ENCOUNTER — Encounter: Payer: Self-pay | Admitting: Adult Health

## 2021-08-31 ENCOUNTER — Ambulatory Visit (INDEPENDENT_AMBULATORY_CARE_PROVIDER_SITE_OTHER): Payer: BC Managed Care – PPO | Admitting: Adult Health

## 2021-08-31 VITALS — BP 110/68 | HR 72 | Temp 98.2°F | Wt 245.5 lb

## 2021-08-31 DIAGNOSIS — M545 Low back pain, unspecified: Secondary | ICD-10-CM | POA: Diagnosis not present

## 2021-08-31 DIAGNOSIS — Z23 Encounter for immunization: Secondary | ICD-10-CM

## 2021-08-31 NOTE — Progress Notes (Signed)
Subjective:    Patient ID: David Salazar, male    DOB: [birth year], 45 y.o.   MRN: [id number]  HPI 45 year old male who  has a past medical history of Alcohol abuse, Chronic lower back pain, and PONV (postoperative nausea and vomiting).  He is being evaluated today for low back pain that has been present for the last couple weeks.  Seems to be a little bit worse on his right side.  Does have episodes of bilateral sciatica which is mild.  Has not had any known injury, or heavy lifting.  He does have a history of herniated disc status post laminectomy and discectomy.  Has been taking ibuprofen and Tylenol with minimal relief.  Was seen in the emergency room on 08/25/2021 and prescribed prednisone Flexeril.  He ended up not taking the prednisone due to side effects he is experienced in the past but has taken Flexeril with some relief.  Also using TENS unit at home and a heating pad with some relief.  He would like to be referred to physical therapy   Review of Systems See HPI   Past Medical History:  Diagnosis Date   Alcohol abuse    Sober for 7 years ( 2017).    Chronic lower back pain    "hopefull not after this OR today" (03/18/2016)   PONV (postoperative nausea and vomiting)    "just w/my wisdom teeth" (03/18/2016)    Social History   Socioeconomic History   Marital status: Married    Spouse name: Not on file   Number of children: Not on file   Years of education: Not on file   Highest education level: Not on file  Occupational History   Not on file  Tobacco Use   Smoking status: Former    Years: 15.00    Types: E-cigarettes, Cigarettes   Smokeless tobacco: Never   Tobacco comments:    03/18/2016 "I use an electronic cigarette"  Vaping Use   Vaping Use: Never used  Substance and Sexual Activity   Alcohol use: No   Drug use: No   Sexual activity: Yes  Other Topics Concern   Not on file  Social History Narrative   Not on file   Social Determinants of Health    Financial Resource Strain: Not on file  Food Insecurity: Not on file  Transportation Needs: Not on file  Physical Activity: Not on file  Stress: Not on file  Social Connections: Not on file  Intimate Partner Violence: Not on file    Past Surgical History:  Procedure Laterality Date   BACK SURGERY     LUMBAR LAMINECTOMY/DECOMPRESSION MICRODISCECTOMY N/A 03/18/2016   Procedure: Right L5-S1 Microdiscectomy;  Surgeon: Eldred Manges, MD;  Location: [facility] OR;  Service: Orthopedics;  Laterality: N/A;   MICRODISCECTOMY LUMBAR Right 03/18/2016   L5-S1   WISDOM TOOTH EXTRACTION  1992    Family History  Problem Relation Age of Onset   Alcohol abuse Father    Hyperlipidemia Father    Diabetes Father     Allergies  Allergen Reactions   Citalopram Anxiety    Abdominal Pain     Current Outpatient Medications on File Prior to Visit  Medication Sig Dispense Refill   ALPRAZolam (XANAX) 1 MG tablet      ARIPiprazole (ABILIFY) 2 MG tablet Take 4 mg by mouth every morning.     buPROPion (WELLBUTRIN XL) 300 MG 24 hr tablet Take 300 mg by mouth every morning.  cyclobenzaprine (FLEXERIL) 10 MG tablet Take 1 tablet (10 mg total) by mouth 3 (three) times daily as needed for muscle spasms. Do not drink alcohol or drive while taking this medication.  May cause drowsiness. 15 tablet 0   DULoxetine (CYMBALTA) 60 MG capsule Take 120 mg by mouth daily.     ibuprofen (ADVIL,MOTRIN) 200 MG tablet Take 200 mg by mouth every 6 (six) hours as needed.     predniSONE (DELTASONE) 10 MG tablet Take 6 tabs day one, 5 tabs day two, 4 tabs day three, etc 21 tablet 0   propranolol (INDERAL) 10 MG tablet SMARTSIG:1 Tablet(s) By Mouth 1-2 Times Daily     rosuvastatin (CRESTOR) 5 MG tablet Take 1 tablet (5 mg total) by mouth daily. 30 tablet 3   traZODone (DESYREL) 50 MG tablet Take 50-100 mg by mouth at bedtime.     No current facility-administered medications on file prior to visit.    BP 110/68 (BP Location:  Left Arm, Patient Position: Sitting, Cuff Size: Normal)   Pulse 72   Temp 98.2 F (36.8 C) (Oral)   Wt 245 lb 8 oz (111.4 kg)   SpO2 97%   BMI 29.88 kg/m       Objective:   Physical Exam Vitals and nursing note reviewed.  Musculoskeletal:        General: Normal range of motion.  Skin:    General: Skin is warm and dry.     Capillary Refill: Capillary refill takes less than 2 seconds.  Neurological:     General: No focal deficit present.     Mental Status: He is oriented to person, place, and time.  Psychiatric:        Mood and Affect: Mood normal.        Behavior: Behavior normal.        Thought Content: Thought content normal.        Judgment: Judgment normal.       Assessment & Plan:  1. Acute bilateral low back pain without sciatica  - Ambulatory referral to Physical Therapy  Shirline Frees, NP

## 2021-08-31 NOTE — Addendum Note (Signed)
Addended by: Kern Reap B on: 08/31/2021 04:46 PM   Modules accepted: Orders

## 2021-09-09 ENCOUNTER — Encounter: Payer: Self-pay | Admitting: Adult Health

## 2021-09-09 ENCOUNTER — Other Ambulatory Visit: Payer: Self-pay | Admitting: Adult Health

## 2021-09-09 MED ORDER — METHOCARBAMOL 500 MG PO TABS: 500.0000 mg | ORAL_TABLET | Freq: Three times a day (TID) | ORAL | 0 refills | Status: DC | PRN

## 2021-09-09 NOTE — Telephone Encounter (Signed)
Please advise 

## 2021-09-15 ENCOUNTER — Other Ambulatory Visit: Payer: Self-pay

## 2021-09-16 ENCOUNTER — Encounter: Payer: Self-pay | Admitting: Adult Health

## 2021-09-16 ENCOUNTER — Ambulatory Visit (INDEPENDENT_AMBULATORY_CARE_PROVIDER_SITE_OTHER): Payer: BC Managed Care – PPO | Admitting: Adult Health

## 2021-09-16 VITALS — BP 100/60 | HR 80 | Temp 98.7°F | Ht 75.5 in | Wt 234.0 lb

## 2021-09-16 DIAGNOSIS — Z0000 Encounter for general adult medical examination without abnormal findings: Secondary | ICD-10-CM | POA: Diagnosis not present

## 2021-09-16 DIAGNOSIS — E782 Mixed hyperlipidemia: Secondary | ICD-10-CM

## 2021-09-16 DIAGNOSIS — Z114 Encounter for screening for human immunodeficiency virus [HIV]: Secondary | ICD-10-CM | POA: Diagnosis not present

## 2021-09-16 DIAGNOSIS — Z1211 Encounter for screening for malignant neoplasm of colon: Secondary | ICD-10-CM

## 2021-09-16 DIAGNOSIS — F419 Anxiety disorder, unspecified: Secondary | ICD-10-CM | POA: Diagnosis not present

## 2021-09-16 DIAGNOSIS — F32A Depression, unspecified: Secondary | ICD-10-CM

## 2021-09-16 DIAGNOSIS — Z1159 Encounter for screening for other viral diseases: Secondary | ICD-10-CM | POA: Diagnosis not present

## 2021-09-16 DIAGNOSIS — Z23 Encounter for immunization: Secondary | ICD-10-CM | POA: Diagnosis not present

## 2021-09-16 LAB — CBC WITH DIFFERENTIAL/PLATELET
Basophils Absolute: 0 10*3/uL (ref 0.0–0.1)
Basophils Relative: 0.6 % (ref 0.0–3.0)
Eosinophils Absolute: 0.2 10*3/uL (ref 0.0–0.7)
Eosinophils Relative: 3.5 % (ref 0.0–5.0)
HCT: 45.9 % (ref 39.0–52.0)
Hemoglobin: 15.4 g/dL (ref 13.0–17.0)
Lymphocytes Relative: 41.1 % (ref 12.0–46.0)
Lymphs Abs: 2 10*3/uL (ref 0.7–4.0)
MCHC: 33.6 g/dL (ref 30.0–36.0)
MCV: 91.6 fl (ref 78.0–100.0)
Monocytes Absolute: 0.4 10*3/uL (ref 0.1–1.0)
Monocytes Relative: 8.1 % (ref 3.0–12.0)
Neutro Abs: 2.3 10*3/uL (ref 1.4–7.7)
Neutrophils Relative %: 46.7 % (ref 43.0–77.0)
Platelets: 206 10*3/uL (ref 150.0–400.0)
RBC: 5.01 Mil/uL (ref 4.22–5.81)
RDW: 13.4 % (ref 11.5–15.5)
WBC: 4.9 10*3/uL (ref 4.0–10.5)

## 2021-09-16 LAB — LIPID PANEL
Cholesterol: 207 mg/dL — ABNORMAL HIGH (ref 0–200)
HDL: 47.6 mg/dL (ref >39.00)
LDL Cholesterol: 124 mg/dL — ABNORMAL HIGH (ref 0–99)
NonHDL: 159.29
Total CHOL/HDL Ratio: 4
Triglycerides: 177 mg/dL — ABNORMAL HIGH (ref 0.0–149.0)
VLDL: 35.4 mg/dL (ref 0.0–40.0)

## 2021-09-16 LAB — COMPREHENSIVE METABOLIC PANEL
ALT: 14 U/L (ref 0–53)
AST: 17 U/L (ref 0–37)
Albumin: 4.3 g/dL (ref 3.5–5.2)
Alkaline Phosphatase: 65 U/L (ref 39–117)
BUN: 16 mg/dL (ref 6–23)
CO2: 32 mEq/L (ref 19–32)
Calcium: 9.5 mg/dL (ref 8.4–10.5)
Chloride: 104 mEq/L (ref 96–112)
Creatinine, Ser: 1.22 mg/dL (ref 0.40–1.50)
GFR: 70.57 mL/min (ref >60.00)
Glucose, Bld: 86 mg/dL (ref 70–99)
Potassium: 4.8 mEq/L (ref 3.5–5.1)
Sodium: 142 mEq/L (ref 135–145)
Total Bilirubin: 0.4 mg/dL (ref 0.2–1.2)
Total Protein: 7 g/dL (ref 6.0–8.3)

## 2021-09-16 LAB — TSH: TSH: 1.21 u[IU]/mL (ref 0.35–5.50)

## 2021-09-16 LAB — HEMOGLOBIN A1C: Hgb A1c MFr Bld: 5.4 % (ref 4.6–6.5)

## 2021-09-16 NOTE — Patient Instructions (Signed)
It was great seeing you today!  Keep working on weight loss through diet and exercise  Someone will call you to schedule your colonoscopy   We will follow up with you regarding your labs   Physical Therapy   Address: [address], [place], [place] [address] Phone: [phone]

## 2021-09-16 NOTE — Progress Notes (Signed)
Subjective:    Patient ID: David Salazar, male    DOB: 04/29/1976, 45 y.o.   MRN: [id number]  HPI Patient presents for yearly preventative medicine examination. He is a pleasant 45 year old male who  has a past medical history of Alcohol abuse, Chronic lower back pain, and PONV (postoperative nausea and vomiting).  Anxiety and Depression -managed by psychiatry.  Currently prescribed Abilify 2 mg, Cymbalta 60 mg, Wellbutrin 300 mg XR,inderal 10 mg , and Xanax 1 mg as needed.  He does feel well controlled on this therapy  Hyperlipidemia - Prescribed Crestor 5 mg. He has not been taking  Lab Results  Component Value Date   CHOL 231 (H) 05/20/2020   HDL 57.70 05/20/2020   LDLCALC 158 (H) 05/20/2020   TRIG 80.0 05/20/2020   CHOLHDL 4 05/20/2020   Insomnia -managed by psychiatry.  Currently prescribed trazodone 50 to 100 mg nightly.  Colon Cancer screening   All immunizations and health maintenance protocols were reviewed with the patient and needed orders were placed.  Appropriate screening laboratory values were ordered for the patient including screening of hyperlipidemia, renal function and hepatic function. If indicated by BPH, a PSA was ordered.  Medication reconciliation,  past medical history, social history, problem list and allergies were reviewed in detail with the patient  Goals were established with regard to weight loss, exercise, and  diet in compliance with medications Wt Readings from Last 3 Encounters:  09/16/21 234 lb (106.1 kg)  08/31/21 245 lb 8 oz (111.4 kg)  05/20/20 228 lb (103.4 kg)    Review of Systems  Constitutional: Negative.   HENT: Negative.    Eyes: Negative.   Respiratory: Negative.    Cardiovascular: Negative.   Gastrointestinal: Negative.   Endocrine: Negative.   Genitourinary: Negative.   Musculoskeletal:  Positive for back pain (chronic).  Skin: Negative.   Allergic/Immunologic: Negative.   Neurological: Negative.    Hematological: Negative.   Psychiatric/Behavioral: Negative.    All other systems reviewed and are negative. Past Medical History:  Diagnosis Date   Alcohol abuse    Sober for 7 years ( 2017).    Chronic lower back pain    "hopefull not after this OR today" (03/18/2016)   PONV (postoperative nausea and vomiting)    "just w/my wisdom teeth" (03/18/2016)    Social History   Socioeconomic History   Marital status: Married    Spouse name: Not on file   Number of children: Not on file   Years of education: Not on file   Highest education level: Not on file  Occupational History   Not on file  Tobacco Use   Smoking status: Former    Years: 15.00    Types: E-cigarettes, Cigarettes   Smokeless tobacco: Never   Tobacco comments:    03/18/2016 "I use an electronic cigarette"  Vaping Use   Vaping Use: Never used  Substance and Sexual Activity   Alcohol use: No   Drug use: No   Sexual activity: Yes  Other Topics Concern   Not on file  Social History Narrative   Not on file   Social Determinants of Health   Financial Resource Strain: Not on file  Food Insecurity: Not on file  Transportation Needs: Not on file  Physical Activity: Not on file  Stress: Not on file  Social Connections: Not on file  Intimate Partner Violence: Not on file    Past Surgical History:  Procedure Laterality Date   BACK  SURGERY     LUMBAR LAMINECTOMY/DECOMPRESSION MICRODISCECTOMY N/A 03/18/2016   Procedure: Right L5-S1 Microdiscectomy;  Surgeon: Eldred Manges, MD;  Location: [facility] OR;  Service: Orthopedics;  Laterality: N/A;   MICRODISCECTOMY LUMBAR Right 03/18/2016   L5-S1   WISDOM TOOTH EXTRACTION  1992    Family History  Problem Relation Age of Onset   Alcohol abuse Father    Hyperlipidemia Father    Diabetes Father     Allergies  Allergen Reactions   Citalopram Anxiety    Abdominal Pain     Current Outpatient Medications on File Prior to Visit  Medication Sig Dispense Refill    ALPRAZolam (XANAX) 0.5 MG tablet Take 0.5 mg by mouth daily as needed.     ARIPiprazole (ABILIFY) 2 MG tablet Take 4 mg by mouth every morning.     buPROPion (WELLBUTRIN XL) 300 MG 24 hr tablet Take 300 mg by mouth every morning.     DULoxetine (CYMBALTA) 60 MG capsule Take 120 mg by mouth daily.     ibuprofen (ADVIL,MOTRIN) 200 MG tablet Take 200 mg by mouth every 6 (six) hours as needed.     methocarbamol (ROBAXIN) 500 MG tablet Take 1 tablet (500 mg total) by mouth every 8 (eight) hours as needed for muscle spasms. 15 tablet 0   propranolol (INDERAL) 10 MG tablet as needed.     traZODone (DESYREL) 50 MG tablet Take 50-100 mg by mouth at bedtime. (Patient not taking: Reported on 09/16/2021)     No current facility-administered medications on file prior to visit.    BP 100/60   Pulse 80   Temp 98.7 F (37.1 C) (Oral)   Ht 6' 3.5" (1.918 m)   Wt 234 lb (106.1 kg)   SpO2 98%   BMI 28.86 kg/m       Objective:   Physical Exam Vitals and nursing note reviewed.  Constitutional:      General: He is not in acute distress.    Appearance: Normal appearance. He is well-developed and overweight.  HENT:     Head: Normocephalic and atraumatic.     Right Ear: Tympanic membrane, ear canal and external ear normal. There is no impacted cerumen.     Left Ear: Tympanic membrane, ear canal and external ear normal. There is no impacted cerumen.     Nose: Nose normal. No congestion or rhinorrhea.     Mouth/Throat:     Mouth: Mucous membranes are moist.     Pharynx: Oropharynx is clear. No oropharyngeal exudate or posterior oropharyngeal erythema.  Eyes:     General:        Right eye: No discharge.        Left eye: No discharge.     Extraocular Movements: Extraocular movements intact.     Conjunctiva/sclera: Conjunctivae normal.     Pupils: Pupils are equal, round, and reactive to light.  Neck:     Vascular: No carotid bruit.     Trachea: No tracheal deviation.  Cardiovascular:     Rate and  Rhythm: Normal rate and regular rhythm.     Pulses: Normal pulses.     Heart sounds: Normal heart sounds. No murmur heard.   No friction rub. No gallop.  Pulmonary:     Effort: Pulmonary effort is normal. No respiratory distress.     Breath sounds: Normal breath sounds. No stridor. No wheezing, rhonchi or rales.  Chest:     Chest wall: No tenderness.  Abdominal:     General:  Bowel sounds are normal. There is no distension.     Palpations: Abdomen is soft. There is no mass.     Tenderness: There is no abdominal tenderness. There is no right CVA tenderness, left CVA tenderness, guarding or rebound.     Hernia: No hernia is present.  Musculoskeletal:        General: No swelling, tenderness, deformity or signs of injury. Normal range of motion.     Right lower leg: No edema.     Left lower leg: No edema.  Lymphadenopathy:     Cervical: No cervical adenopathy.  Skin:    General: Skin is warm and dry.     Capillary Refill: Capillary refill takes less than 2 seconds.     Coloration: Skin is not jaundiced or pale.     Findings: No bruising, erythema, lesion or rash.  Neurological:     General: No focal deficit present.     Mental Status: He is alert and oriented to person, place, and time.     Cranial Nerves: No cranial nerve deficit.     Sensory: No sensory deficit.     Motor: No weakness.     Coordination: Coordination normal.     Gait: Gait normal.     Deep Tendon Reflexes: Reflexes normal.  Psychiatric:        Mood and Affect: Mood normal.        Behavior: Behavior normal.        Thought Content: Thought content normal.        Judgment: Judgment normal.      Assessment & Plan:  1. Routine general medical examination at a health care facility - Follow up in one year   - Continue to work on weight loss through diet and exerise - CBC with Differential/Platelet; Future - Comprehensive metabolic panel; Future - Lipid panel; Future - TSH; Future - Hemoglobin A1c; Future  2.  Anxiety and depression - Follow up with Psychiatry as directed  3. Mixed hyperlipidemia - Consider placing back on statin  - CBC with Differential/Platelet; Future - Comprehensive metabolic panel; Future - Lipid panel; Future - TSH; Future - Hemoglobin A1c; Future  4. Need for hepatitis C screening test  - Hep C Antibody; Future  5. Encounter for screening for HIV  - HIV Antibody (routine testing w rflx); Future  6. Colon cancer screening  - Ambulatory referral to Gastroenterology  Shirline Frees, NP

## 2021-09-16 NOTE — Addendum Note (Signed)
Addended by: Bonnye Fava on: 09/16/2021 08:27 AM   Modules accepted: Orders

## 2021-09-17 LAB — HEPATITIS C ANTIBODY
Hepatitis C Ab: NONREACTIVE
SIGNAL TO CUT-OFF: 0.09 (ref <1.00)

## 2021-09-17 LAB — HIV ANTIBODY (ROUTINE TESTING W REFLEX): HIV 1&2 Ab, 4th Generation: NONREACTIVE

## 2021-10-04 ENCOUNTER — Encounter: Payer: Self-pay | Admitting: Adult Health

## 2021-10-06 ENCOUNTER — Other Ambulatory Visit: Payer: Self-pay | Admitting: Adult Health

## 2021-10-06 MED ORDER — METHOCARBAMOL 500 MG PO TABS: 500.0000 mg | ORAL_TABLET | Freq: Three times a day (TID) | ORAL | 0 refills | Status: DC | PRN

## 2021-10-06 NOTE — Telephone Encounter (Signed)
Please advise 

## 2021-10-27 DIAGNOSIS — F4321 Adjustment disorder with depressed mood: Secondary | ICD-10-CM | POA: Diagnosis not present

## 2021-10-27 DIAGNOSIS — F410 Panic disorder [episodic paroxysmal anxiety] without agoraphobia: Secondary | ICD-10-CM | POA: Diagnosis not present

## 2021-10-27 DIAGNOSIS — F1011 Alcohol abuse, in remission: Secondary | ICD-10-CM | POA: Diagnosis not present

## 2021-11-26 DIAGNOSIS — F410 Panic disorder [episodic paroxysmal anxiety] without agoraphobia: Secondary | ICD-10-CM | POA: Diagnosis not present

## 2021-11-26 DIAGNOSIS — F4321 Adjustment disorder with depressed mood: Secondary | ICD-10-CM | POA: Diagnosis not present

## 2021-11-26 DIAGNOSIS — F1011 Alcohol abuse, in remission: Secondary | ICD-10-CM | POA: Diagnosis not present

## 2021-11-30 DIAGNOSIS — F1011 Alcohol abuse, in remission: Secondary | ICD-10-CM | POA: Diagnosis not present

## 2021-11-30 DIAGNOSIS — F410 Panic disorder [episodic paroxysmal anxiety] without agoraphobia: Secondary | ICD-10-CM | POA: Diagnosis not present

## 2021-11-30 DIAGNOSIS — F4321 Adjustment disorder with depressed mood: Secondary | ICD-10-CM | POA: Diagnosis not present

## 2021-12-24 DIAGNOSIS — F1011 Alcohol abuse, in remission: Secondary | ICD-10-CM | POA: Diagnosis not present

## 2021-12-24 DIAGNOSIS — F4321 Adjustment disorder with depressed mood: Secondary | ICD-10-CM | POA: Diagnosis not present

## 2021-12-24 DIAGNOSIS — F410 Panic disorder [episodic paroxysmal anxiety] without agoraphobia: Secondary | ICD-10-CM | POA: Diagnosis not present

## 2022-01-13 DIAGNOSIS — F1011 Alcohol abuse, in remission: Secondary | ICD-10-CM | POA: Diagnosis not present

## 2022-01-13 DIAGNOSIS — F410 Panic disorder [episodic paroxysmal anxiety] without agoraphobia: Secondary | ICD-10-CM | POA: Diagnosis not present

## 2022-01-13 DIAGNOSIS — F4321 Adjustment disorder with depressed mood: Secondary | ICD-10-CM | POA: Diagnosis not present

## 2022-02-09 DIAGNOSIS — F1011 Alcohol abuse, in remission: Secondary | ICD-10-CM | POA: Diagnosis not present

## 2022-02-09 DIAGNOSIS — F4321 Adjustment disorder with depressed mood: Secondary | ICD-10-CM | POA: Diagnosis not present

## 2022-02-09 DIAGNOSIS — F410 Panic disorder [episodic paroxysmal anxiety] without agoraphobia: Secondary | ICD-10-CM | POA: Diagnosis not present

## 2022-03-10 DIAGNOSIS — F4321 Adjustment disorder with depressed mood: Secondary | ICD-10-CM | POA: Diagnosis not present

## 2022-03-10 DIAGNOSIS — F410 Panic disorder [episodic paroxysmal anxiety] without agoraphobia: Secondary | ICD-10-CM | POA: Diagnosis not present

## 2022-03-10 DIAGNOSIS — F1011 Alcohol abuse, in remission: Secondary | ICD-10-CM | POA: Diagnosis not present

## 2022-06-07 DIAGNOSIS — F410 Panic disorder [episodic paroxysmal anxiety] without agoraphobia: Secondary | ICD-10-CM | POA: Diagnosis not present

## 2022-06-07 DIAGNOSIS — F1011 Alcohol abuse, in remission: Secondary | ICD-10-CM | POA: Diagnosis not present

## 2022-06-07 DIAGNOSIS — F4321 Adjustment disorder with depressed mood: Secondary | ICD-10-CM | POA: Diagnosis not present

## 2022-12-13 DIAGNOSIS — F1011 Alcohol abuse, in remission: Secondary | ICD-10-CM | POA: Diagnosis not present

## 2022-12-13 DIAGNOSIS — F4321 Adjustment disorder with depressed mood: Secondary | ICD-10-CM | POA: Diagnosis not present

## 2022-12-13 DIAGNOSIS — F410 Panic disorder [episodic paroxysmal anxiety] without agoraphobia: Secondary | ICD-10-CM | POA: Diagnosis not present

## 2023-02-02 DIAGNOSIS — F4321 Adjustment disorder with depressed mood: Secondary | ICD-10-CM | POA: Diagnosis not present

## 2023-02-02 DIAGNOSIS — F1011 Alcohol abuse, in remission: Secondary | ICD-10-CM | POA: Diagnosis not present

## 2023-02-02 DIAGNOSIS — F410 Panic disorder [episodic paroxysmal anxiety] without agoraphobia: Secondary | ICD-10-CM | POA: Diagnosis not present

## 2023-02-02 DIAGNOSIS — Z79899 Other long term (current) drug therapy: Secondary | ICD-10-CM | POA: Diagnosis not present

## 2023-03-20 DIAGNOSIS — J069 Acute upper respiratory infection, unspecified: Secondary | ICD-10-CM | POA: Diagnosis not present

## 2023-03-20 DIAGNOSIS — H66001 Acute suppurative otitis media without spontaneous rupture of ear drum, right ear: Secondary | ICD-10-CM | POA: Diagnosis not present

## 2023-03-24 DIAGNOSIS — J069 Acute upper respiratory infection, unspecified: Secondary | ICD-10-CM | POA: Diagnosis not present

## 2023-05-03 DIAGNOSIS — F410 Panic disorder [episodic paroxysmal anxiety] without agoraphobia: Secondary | ICD-10-CM | POA: Diagnosis not present

## 2023-05-03 DIAGNOSIS — F4321 Adjustment disorder with depressed mood: Secondary | ICD-10-CM | POA: Diagnosis not present

## 2023-05-03 DIAGNOSIS — F1011 Alcohol abuse, in remission: Secondary | ICD-10-CM | POA: Diagnosis not present

## 2023-05-04 ENCOUNTER — Encounter: Payer: Self-pay | Admitting: Adult Health

## 2023-05-04 NOTE — Progress Notes (Deleted)
Subjective:    Patient ID: David Salazar, male    DOB: 20-Dec-1976, 47 y.o.   MRN: [id number]  HPI Patient presents for yearly preventative medicine examination. He is a pleasant 47 year old male who  has a past medical history of Alcohol abuse, Chronic lower back pain, and PONV (postoperative nausea and vomiting).  He was last seen in the office in 08/2021  Anxiety and Depression -managed by psychiatry.  Currently prescribed Abilify 2 mg, Cymbalta 60 mg, Wellbutrin 300 mg XR,inderal 10 mg , and Xanax 1 mg as needed.  He does feel well controlled on this therapy  Hyperlipidemia - Prescribed Crestor 5 mg in the past but he has not been taking this medication  Lab Results  Component Value Date   CHOL 207 (H) 09/16/2021   HDL 47.60 09/16/2021   LDLCALC 124 (H) 09/16/2021   TRIG 177.0 (H) 09/16/2021   CHOLHDL 4 09/16/2021    Insomnia -managed by psychiatry.  Currently prescribed trazodone 50 to 100 mg nightly.  All immunizations and health maintenance protocols were reviewed with the patient and needed orders were placed.  Appropriate screening laboratory values were ordered for the patient including screening of hyperlipidemia, renal function and hepatic function.  Medication reconciliation,  past medical history, social history, problem list and allergies were reviewed in detail with the patient  Goals were established with regard to weight loss, exercise, and  diet in compliance with medications Wt Readings from Last 3 Encounters:  09/16/21 234 lb (106.1 kg)  08/31/21 245 lb 8 oz (111.4 kg)  05/20/20 228 lb (103.4 kg)    Review of Systems  Constitutional: Negative.   HENT: Negative.    Eyes: Negative.   Respiratory: Negative.    Cardiovascular: Negative.   Gastrointestinal: Negative.   Endocrine: Negative.   Genitourinary: Negative.   Musculoskeletal: Negative.   Skin: Negative.   Allergic/Immunologic: Negative.   Neurological: Negative.   Hematological:  Negative.   Psychiatric/Behavioral: Negative.    All other systems reviewed and are negative.  Past Medical History:  Diagnosis Date   Alcohol abuse    Sober for 7 years ( 2017).    Chronic lower back pain    "hopefull not after this OR today" (03/18/2016)   PONV (postoperative nausea and vomiting)    "just w/my wisdom teeth" (03/18/2016)    Social History   Socioeconomic History   Marital status: Married    Spouse name: Not on file   Number of children: Not on file   Years of education: Not on file   Highest education level: Not on file  Occupational History   Not on file  Tobacco Use   Smoking status: Former    Years: 15    Types: E-cigarettes, Cigarettes   Smokeless tobacco: Never   Tobacco comments:    03/18/2016 "I use an electronic cigarette"  Vaping Use   Vaping Use: Never used  Substance and Sexual Activity   Alcohol use: No   Drug use: No   Sexual activity: Yes  Other Topics Concern   Not on file  Social History Narrative   Not on file   Social Determinants of Health   Financial Resource Strain: Not on file  Food Insecurity: Not on file  Transportation Needs: Not on file  Physical Activity: Not on file  Stress: Not on file  Social Connections: Not on file  Intimate Partner Violence: Not on file    Past Surgical History:  Procedure Laterality Date  BACK SURGERY     LUMBAR LAMINECTOMY/DECOMPRESSION MICRODISCECTOMY N/A 03/18/2016   Procedure: Right L5-S1 Microdiscectomy;  Surgeon: Eldred Manges, MD;  Location: [facility];  Service: Orthopedics;  Laterality: N/A;   MICRODISCECTOMY LUMBAR Right 03/18/2016   L5-S1   WISDOM TOOTH EXTRACTION  1992    Family History  Problem Relation Age of Onset   Alcohol abuse Father    Hyperlipidemia Father    Diabetes Father     Allergies  Allergen Reactions   Citalopram Anxiety    Abdominal Pain     Current Outpatient Medications on File Prior to Visit  Medication Sig Dispense Refill   ALPRAZolam (XANAX) 0.5  MG tablet Take 0.5 mg by mouth daily as needed.     ARIPiprazole (ABILIFY) 2 MG tablet Take 4 mg by mouth every morning.     buPROPion (WELLBUTRIN XL) 300 MG 24 hr tablet Take 300 mg by mouth every morning.     DULoxetine (CYMBALTA) 60 MG capsule Take 120 mg by mouth daily.     ibuprofen (ADVIL,MOTRIN) 200 MG tablet Take 200 mg by mouth every 6 (six) hours as needed.     methocarbamol (ROBAXIN) 500 MG tablet Take 1 tablet (500 mg total) by mouth every 8 (eight) hours as needed for muscle spasms. 15 tablet 0   propranolol (INDERAL) 10 MG tablet as needed.     traZODone (DESYREL) 50 MG tablet Take 50-100 mg by mouth at bedtime. (Patient not taking: Reported on 09/16/2021)     No current facility-administered medications on file prior to visit.    There were no vitals taken for this visit.      Objective:   Physical Exam Vitals and nursing note reviewed.  Constitutional:      General: He is not in acute distress.    Appearance: Normal appearance. He is not ill-appearing.  HENT:     Head: Normocephalic and atraumatic.     Right Ear: Tympanic membrane, ear canal and external ear normal. There is no impacted cerumen.     Left Ear: Tympanic membrane, ear canal and external ear normal. There is no impacted cerumen.     Nose: Nose normal. No congestion or rhinorrhea.     Mouth/Throat:     Mouth: Mucous membranes are moist.     Pharynx: Oropharynx is clear.  Eyes:     Extraocular Movements: Extraocular movements intact.     Conjunctiva/sclera: Conjunctivae normal.     Pupils: Pupils are equal, round, and reactive to light.  Neck:     Vascular: No carotid bruit.  Cardiovascular:     Rate and Rhythm: Normal rate and regular rhythm.     Pulses: Normal pulses.     Heart sounds: No murmur heard.    No friction rub. No gallop.  Pulmonary:     Effort: Pulmonary effort is normal.     Breath sounds: Normal breath sounds.  Abdominal:     General: Abdomen is flat. Bowel sounds are normal.  There is no distension.     Palpations: Abdomen is soft. There is no mass.     Tenderness: There is no abdominal tenderness. There is no guarding or rebound.     Hernia: No hernia is present.  Musculoskeletal:        General: Normal range of motion.     Cervical back: Normal range of motion and neck supple.  Lymphadenopathy:     Cervical: No cervical adenopathy.  Skin:    General: Skin is warm  and dry.     Capillary Refill: Capillary refill takes less than 2 seconds.  Neurological:     General: No focal deficit present.     Mental Status: He is alert and oriented to person, place, and time.  Psychiatric:        Mood and Affect: Mood normal.        Behavior: Behavior normal.        Thought Content: Thought content normal.        Judgment: Judgment normal.           Assessment & Plan:

## 2023-11-21 DIAGNOSIS — M25531 Pain in right wrist: Secondary | ICD-10-CM | POA: Diagnosis not present

## 2023-11-21 DIAGNOSIS — W1830XA Fall on same level, unspecified, initial encounter: Secondary | ICD-10-CM | POA: Diagnosis not present

## 2023-11-21 DIAGNOSIS — S63641A Sprain of metacarpophalangeal joint of right thumb, initial encounter: Secondary | ICD-10-CM | POA: Diagnosis not present

## 2024-01-08 DIAGNOSIS — F1011 Alcohol abuse, in remission: Secondary | ICD-10-CM | POA: Diagnosis not present

## 2024-01-08 DIAGNOSIS — F4321 Adjustment disorder with depressed mood: Secondary | ICD-10-CM | POA: Diagnosis not present

## 2024-01-08 DIAGNOSIS — F410 Panic disorder [episodic paroxysmal anxiety] without agoraphobia: Secondary | ICD-10-CM | POA: Diagnosis not present

## 2024-03-17 DIAGNOSIS — J208 Acute bronchitis due to other specified organisms: Secondary | ICD-10-CM | POA: Diagnosis not present

## 2024-04-06 DIAGNOSIS — S81851A Open bite, right lower leg, initial encounter: Secondary | ICD-10-CM | POA: Diagnosis not present

## 2024-04-06 DIAGNOSIS — W540XXA Bitten by dog, initial encounter: Secondary | ICD-10-CM | POA: Diagnosis not present

## 2024-04-06 DIAGNOSIS — Z23 Encounter for immunization: Secondary | ICD-10-CM | POA: Diagnosis not present

## 2024-07-05 DIAGNOSIS — F410 Panic disorder [episodic paroxysmal anxiety] without agoraphobia: Secondary | ICD-10-CM | POA: Diagnosis not present

## 2024-07-05 DIAGNOSIS — F4321 Adjustment disorder with depressed mood: Secondary | ICD-10-CM | POA: Diagnosis not present

## 2024-07-05 DIAGNOSIS — F1011 Alcohol abuse, in remission: Secondary | ICD-10-CM | POA: Diagnosis not present

## 2024-07-16 ENCOUNTER — Ambulatory Visit (INDEPENDENT_AMBULATORY_CARE_PROVIDER_SITE_OTHER): Admitting: Adult Health

## 2024-07-16 ENCOUNTER — Encounter: Payer: Self-pay | Admitting: Adult Health

## 2024-07-16 ENCOUNTER — Ambulatory Visit: Payer: Self-pay | Admitting: Adult Health

## 2024-07-16 VITALS — BP 130/84 | HR 85 | Temp 98.3°F | Ht 75.5 in | Wt 247.0 lb

## 2024-07-16 DIAGNOSIS — R3912 Poor urinary stream: Secondary | ICD-10-CM

## 2024-07-16 DIAGNOSIS — Z0000 Encounter for general adult medical examination without abnormal findings: Secondary | ICD-10-CM | POA: Diagnosis not present

## 2024-07-16 DIAGNOSIS — E782 Mixed hyperlipidemia: Secondary | ICD-10-CM

## 2024-07-16 DIAGNOSIS — Z1211 Encounter for screening for malignant neoplasm of colon: Secondary | ICD-10-CM

## 2024-07-16 DIAGNOSIS — F32A Depression, unspecified: Secondary | ICD-10-CM

## 2024-07-16 DIAGNOSIS — F419 Anxiety disorder, unspecified: Secondary | ICD-10-CM

## 2024-07-16 DIAGNOSIS — N401 Enlarged prostate with lower urinary tract symptoms: Secondary | ICD-10-CM | POA: Diagnosis not present

## 2024-07-16 DIAGNOSIS — Z23 Encounter for immunization: Secondary | ICD-10-CM

## 2024-07-16 LAB — LIPID PANEL
Cholesterol: 242 mg/dL — ABNORMAL HIGH (ref 0–200)
HDL: 72.6 mg/dL (ref >39.00)
LDL Cholesterol: 151 mg/dL — ABNORMAL HIGH (ref 0–99)
NonHDL: 169.88
Total CHOL/HDL Ratio: 3
Triglycerides: 95 mg/dL (ref 0.0–149.0)
VLDL: 19 mg/dL (ref 0.0–40.0)

## 2024-07-16 LAB — COMPREHENSIVE METABOLIC PANEL WITH GFR
ALT: 31 U/L (ref 0–53)
AST: 35 U/L (ref 0–37)
Albumin: 4.3 g/dL (ref 3.5–5.2)
Alkaline Phosphatase: 76 U/L (ref 39–117)
BUN: 17 mg/dL (ref 6–23)
CO2: 31 meq/L (ref 19–32)
Calcium: 9.3 mg/dL (ref 8.4–10.5)
Chloride: 104 meq/L (ref 96–112)
Creatinine, Ser: 1.32 mg/dL (ref 0.40–1.50)
GFR: 62.94 mL/min (ref >60.00)
Glucose, Bld: 85 mg/dL (ref 70–99)
Potassium: 4.3 meq/L (ref 3.5–5.1)
Sodium: 141 meq/L (ref 135–145)
Total Bilirubin: 0.6 mg/dL (ref 0.2–1.2)
Total Protein: 6.5 g/dL (ref 6.0–8.3)

## 2024-07-16 LAB — CBC WITH DIFFERENTIAL/PLATELET
Basophils Absolute: 0 K/uL (ref 0.0–0.1)
Basophils Relative: 0.7 % (ref 0.0–3.0)
Eosinophils Absolute: 0.1 K/uL (ref 0.0–0.7)
Eosinophils Relative: 2.7 % (ref 0.0–5.0)
HCT: 41.6 % (ref 39.0–52.0)
Hemoglobin: 13.6 g/dL (ref 13.0–17.0)
Lymphocytes Relative: 36.5 % (ref 12.0–46.0)
Lymphs Abs: 1.9 K/uL (ref 0.7–4.0)
MCHC: 32.6 g/dL (ref 30.0–36.0)
MCV: 91.9 fl (ref 78.0–100.0)
Monocytes Absolute: 0.5 K/uL (ref 0.1–1.0)
Monocytes Relative: 9 % (ref 3.0–12.0)
Neutro Abs: 2.6 K/uL (ref 1.4–7.7)
Neutrophils Relative %: 51.1 % (ref 43.0–77.0)
Platelets: 197 K/uL (ref 150.0–400.0)
RBC: 4.52 Mil/uL (ref 4.22–5.81)
RDW: 13.5 % (ref 11.5–15.5)
WBC: 5.2 K/uL (ref 4.0–10.5)

## 2024-07-16 LAB — TSH: TSH: 1.28 u[IU]/mL (ref 0.35–5.50)

## 2024-07-16 LAB — PSA: PSA: 0.25 ng/mL (ref 0.10–4.00)

## 2024-07-16 NOTE — Progress Notes (Signed)
 Subjective:    Patient ID: David Salazar, male    DOB: [birth year], 48 y.o.   MRN: [id number]  HPI Patient presents for yearly preventative medicine examination. He is a pleasant 48 year old male who  has a past medical history of Alcohol abuse, Chronic lower back pain, and PONV (postoperative nausea and vomiting).  His last CPE was in 2022.   Anxiety and Depression -managed by psychiatry.  Currently prescribed Rexulti 1 mg,  Cymbalta 60 mg, Wellbutrin 300 mg XR,inderal 10 mg , and Xanax  1 mg as needed.  He does feel well controlled on this therapy  Hyperlipidemia - Prescribed Crestor  5 mg. He has not been taking  Lab Results  Component Value Date   CHOL 207 (H) 09/16/2021   HDL 47.60 09/16/2021   LDLCALC 124 (H) 09/16/2021   TRIG 177.0 (H) 09/16/2021   CHOLHDL 4 09/16/2021   BPH - has mild decreased stream. Does not want to start medication   All immunizations and health maintenance protocols were reviewed with the patient and needed orders were placed.  Appropriate screening laboratory values were ordered for the patient including screening of hyperlipidemia, renal function and hepatic function. If indicated by BPH, a PSA was ordered.  Medication reconciliation,  past medical history, social history, problem list and allergies were reviewed in detail with the patient  Goals were established with regard to weight loss, exercise, and  diet in compliance with medications. He is getting back into walking  Wt Readings from Last 3 Encounters:  07/16/24 247 lb (112 kg)  09/16/21 234 lb (106.1 kg)  08/31/21 245 lb 8 oz (111.4 kg)     He is overdue for colon cancer screening.   Review of Systems  Constitutional: Negative.   HENT: Negative.    Eyes: Negative.   Respiratory: Negative.    Cardiovascular: Negative.   Gastrointestinal: Negative.   Endocrine: Negative.   Genitourinary: Negative.   Musculoskeletal: Negative.   Skin: Negative.   Allergic/Immunologic:  Negative.   Neurological: Negative.   Hematological: Negative.   Psychiatric/Behavioral: Negative.    All other systems reviewed and are negative.  Past Medical History:  Diagnosis Date   Alcohol abuse    Sober for 7 years ( 2017).    Chronic lower back pain    hopefull not after this OR today (03/18/2016)   PONV (postoperative nausea and vomiting)    just w/my wisdom teeth (03/18/2016)    Social History   Socioeconomic History   Marital status: Married    Spouse name: Not on file   Number of children: Not on file   Years of education: Not on file   Highest education level: Bachelor's degree (e.g., BA, AB, BS)  Occupational History   Not on file  Tobacco Use   Smoking status: Former    Types: E-cigarettes, Cigarettes   Smokeless tobacco: Never   Tobacco comments:    03/18/2016 I use an electronic cigarette  Vaping Use   Vaping status: Never Used  Substance and Sexual Activity   Alcohol use: No   Drug use: No   Sexual activity: Yes  Other Topics Concern   Not on file  Social History Narrative   Not on file   Social Drivers of Health   Financial Resource Strain: Low Risk  (07/15/2024)   Overall Financial Resource Strain (CARDIA)    Difficulty of Paying Living Expenses: Not very hard  Food Insecurity: No Food Insecurity (07/15/2024)   Hunger Vital Sign  Worried About Programme researcher, broadcasting/film/video in the Last Year: Never true    Ran Out of Food in the Last Year: Never true  Transportation Needs: No Transportation Needs (07/15/2024)   PRAPARE - Administrator, Civil Service (Medical): No    Lack of Transportation (Non-Medical): No  Physical Activity: Sufficiently Active (07/15/2024)   Exercise Vital Sign    Days of Exercise per Week: 5 days    Minutes of Exercise per Session: 50 min  Stress: Stress Concern Present (07/15/2024)   [employer] of Occupational Health - Occupational Stress Questionnaire    Feeling of Stress: To some extent  Social  Connections: Moderately Integrated (07/15/2024)   Social Connection and Isolation Panel    Frequency of Communication with Friends and Family: More than three times a week    Frequency of Social Gatherings with Friends and Family: Twice a week    Attends Religious Services: Never    Database administrator or Organizations: Yes    Attends Engineer, structural: More than 4 times per year    Marital Status: Married  Catering manager Violence: Not on file    Past Surgical History:  Procedure Laterality Date   BACK SURGERY     LUMBAR LAMINECTOMY/DECOMPRESSION MICRODISCECTOMY N/A 03/18/2016   Procedure: Right L5-S1 Microdiscectomy;  Surgeon: Oneil JAYSON Herald, MD;  Location: [facility] OR;  Service: Orthopedics;  Laterality: N/A;   MICRODISCECTOMY LUMBAR Right 03/18/2016   L5-S1   WISDOM TOOTH EXTRACTION  1992    Family History  Problem Relation Age of Onset   Alcohol abuse Father    Hyperlipidemia Father    Diabetes Father     Allergies  Allergen Reactions   Citalopram  Anxiety    Abdominal Pain     Current Outpatient Medications on File Prior to Visit  Medication Sig Dispense Refill   ALPRAZolam  (XANAX ) 0.5 MG tablet Take 0.5 mg by mouth daily as needed.     buPROPion (WELLBUTRIN XL) 300 MG 24 hr tablet Take 300 mg by mouth every morning.     DULoxetine (CYMBALTA) 60 MG capsule Take 120 mg by mouth daily.     ibuprofen  (ADVIL ,MOTRIN ) 200 MG tablet Take 200 mg by mouth every 6 (six) hours as needed.     propranolol (INDERAL) 10 MG tablet as needed.     REXULTI 1 MG TABS tablet Take 1 mg by mouth every morning.     No current facility-administered medications on file prior to visit.    BP 130/84   Pulse 85   Temp 98.3 F (36.8 C)   Wt 247 lb (112 kg)   SpO2 97%   BMI 30.47 kg/m       Objective:   Physical Exam Vitals and nursing note reviewed.  Constitutional:      General: He is not in acute distress.    Appearance: Normal appearance. He is obese. He is not  ill-appearing.  HENT:     Head: Normocephalic and atraumatic.     Right Ear: Tympanic membrane, ear canal and external ear normal. There is no impacted cerumen.     Left Ear: Tympanic membrane, ear canal and external ear normal. There is no impacted cerumen.     Nose: Nose normal. No congestion or rhinorrhea.     Mouth/Throat:     Mouth: Mucous membranes are moist.     Pharynx: Oropharynx is clear.  Eyes:     Extraocular Movements: Extraocular movements intact.  Conjunctiva/sclera: Conjunctivae normal.     Pupils: Pupils are equal, round, and reactive to light.  Neck:     Vascular: No carotid bruit.  Cardiovascular:     Rate and Rhythm: Normal rate and regular rhythm.     Pulses: Normal pulses.     Heart sounds: No murmur heard.    No friction rub. No gallop.  Pulmonary:     Effort: Pulmonary effort is normal.     Breath sounds: Normal breath sounds.  Abdominal:     General: Abdomen is flat. Bowel sounds are normal. There is no distension.     Palpations: Abdomen is soft. There is no mass.     Tenderness: There is no abdominal tenderness. There is no guarding or rebound.     Hernia: No hernia is present.  Musculoskeletal:        General: Normal range of motion.     Cervical back: Normal range of motion and neck supple.  Lymphadenopathy:     Cervical: No cervical adenopathy.  Skin:    General: Skin is warm and dry.     Capillary Refill: Capillary refill takes less than 2 seconds.  Neurological:     General: No focal deficit present.     Mental Status: He is alert and oriented to person, place, and time.  Psychiatric:        Mood and Affect: Mood normal.        Behavior: Behavior normal.        Thought Content: Thought content normal.        Judgment: Judgment normal.       Assessment & Plan:  1. Routine general medical examination at a health care facility (Primary) Today patient counseled on age appropriate routine health concerns for screening and prevention, each  reviewed and up to date or declined. Immunizations reviewed and up to date or declined. Labs ordered and reviewed. Risk factors for depression reviewed and negative. Hearing function and visual acuity are intact. ADLs screened and addressed as needed. Functional ability and level of safety reviewed and appropriate. Education, counseling and referrals performed based on assessed risks today. Patient provided with a copy of personalized plan for preventive services.   2. Anxiety and depression - per psychiatry   3. Mixed hyperlipidemia - Consider restarting statin  - CBC with Differential/Platelet; Future - Comprehensive metabolic panel with GFR; Future - Lipid panel; Future - TSH; Future - CT CARDIAC SCORING (SELF PAY ONLY); Future  4. Screening for colon cancer  - Ambulatory referral to Gastroenterology  5. Benign prostatic hyperplasia with weak urinary stream  - PSA; Future  6. Need for shingles vaccine  - Varicella-zoster vaccine IM  Teeghan Hammer, NP

## 2024-07-16 NOTE — Patient Instructions (Addendum)
 It was great seeing you today   We will follow up with you regarding your lab work   Please let me know if you need anything   Someone will call you to schedule your colonoscopy and CT Cardiac Scoring test  Please follow up in about 3 months for the second shingles vaccination

## 2024-08-20 ENCOUNTER — Encounter: Payer: Self-pay | Admitting: Adult Health

## 2024-09-26 ENCOUNTER — Encounter: Payer: Self-pay | Admitting: Adult Health

## 2024-10-15 ENCOUNTER — Encounter: Payer: Self-pay | Admitting: Adult Health

## 2024-10-17 ENCOUNTER — Encounter: Payer: Self-pay | Admitting: Adult Health

## 2024-10-22 ENCOUNTER — Ambulatory Visit: Admitting: Adult Health

## 2024-10-22 ENCOUNTER — Encounter: Payer: Self-pay | Admitting: Adult Health

## 2024-10-22 VITALS — BP 132/82 | HR 84 | Temp 97.9°F | Ht 75.5 in | Wt 262.0 lb

## 2024-10-22 DIAGNOSIS — R600 Localized edema: Secondary | ICD-10-CM | POA: Diagnosis not present

## 2024-10-22 MED ORDER — FUROSEMIDE 20 MG PO TABS: 20.0000 mg | ORAL_TABLET | Freq: Every day | ORAL | 0 refills | Status: AC

## 2024-10-22 NOTE — Progress Notes (Signed)
 Subjective:    Patient ID: David Salazar, male    DOB: [birth year], 48 y.o.   MRN: [id number]  HPI Discussed the use of AI scribe software for clinical note transcription with the patient, who gave verbal consent to proceed.  History of Present Illness   David Salazar is a 48 year old male who presents with bilateral leg swelling.  He has experienced bilateral leg swelling for less than five to six months, more pronounced above the sock line, and constant without improvement from leg elevation. There is no associated pain, shortness of breath, or chest pain. He consumes a high amount of diet sodas and avoids salty foods due to his wife's hypertension. He denies calf pain and has no breathing issues during physical activities like walking his dogs for two to three miles.   He generally feels well-rested when he wakes up. An alert from his Apple Watch suggested possible sleep apnea, but his wife has not observed any breathing interruptions during sleep. He does feel like he could take a nap in the middle of the afternoon        Review of Systems See HPI   Past Medical History:  Diagnosis Date   Alcohol abuse    Sober for 7 years ( 2017).    Chronic lower back pain    hopefull not after this OR today (03/18/2016)   PONV (postoperative nausea and vomiting)    just w/my wisdom teeth (03/18/2016)    Social History   Socioeconomic History   Marital status: Married    Spouse name: Not on file   Number of children: Not on file   Years of education: Not on file   Highest education level: Bachelor's degree (e.g., BA, AB, BS)  Occupational History   Not on file  Tobacco Use   Smoking status: Former    Types: E-cigarettes, Cigarettes   Smokeless tobacco: Never   Tobacco comments:    03/18/2016 I use an electronic cigarette  Vaping Use   Vaping status: Never Used  Substance and Sexual Activity   Alcohol use: No   Drug use: No   Sexual activity: Yes  Other  Topics Concern   Not on file  Social History Narrative   Not on file   Social Drivers of Health   Financial Resource Strain: Low Risk  (07/15/2024)   Overall Financial Resource Strain (CARDIA)    Difficulty of Paying Living Expenses: Not very hard  Food Insecurity: No Food Insecurity (07/15/2024)   Hunger Vital Sign    Worried About Running Out of Food in the Last Year: Never true    Ran Out of Food in the Last Year: Never true  Transportation Needs: No Transportation Needs (07/15/2024)   PRAPARE - Administrator, Civil Service (Medical): No    Lack of Transportation (Non-Medical): No  Physical Activity: Sufficiently Active (07/15/2024)   Exercise Vital Sign    Days of Exercise per Week: 5 days    Minutes of Exercise per Session: 50 min  Stress: Stress Concern Present (07/15/2024)   [employer] of Occupational Health - Occupational Stress Questionnaire    Feeling of Stress: To some extent  Social Connections: Moderately Integrated (07/15/2024)   Social Connection and Isolation Panel    Frequency of Communication with Friends and Family: More than three times a week    Frequency of Social Gatherings with Friends and Family: Twice a week    Attends Religious Services: Never  Active Member of Clubs or Organizations: Yes    Attends Banker Meetings: More than 4 times per year    Marital Status: Married  Catering Manager Violence: Not on file    Past Surgical History:  Procedure Laterality Date   BACK SURGERY     LUMBAR LAMINECTOMY/DECOMPRESSION MICRODISCECTOMY N/A 03/18/2016   Procedure: Right L5-S1 Microdiscectomy;  Surgeon: Oneil JAYSON Herald, MD;  Location: [facility] OR;  Service: Orthopedics;  Laterality: N/A;   MICRODISCECTOMY LUMBAR Right 03/18/2016   L5-S1   WISDOM TOOTH EXTRACTION  1992    Family History  Problem Relation Age of Onset   Alcohol abuse Father    Hyperlipidemia Father    Diabetes Father     Allergies  Allergen Reactions   Citalopram   Anxiety    Abdominal Pain     Current Outpatient Medications on File Prior to Visit  Medication Sig Dispense Refill   ALPRAZolam  (XANAX ) 0.5 MG tablet Take 0.5 mg by mouth daily as needed.     buPROPion (WELLBUTRIN XL) 300 MG 24 hr tablet Take 300 mg by mouth every morning.     DULoxetine (CYMBALTA) 60 MG capsule Take 120 mg by mouth daily.     ibuprofen  (ADVIL ,MOTRIN ) 200 MG tablet Take 200 mg by mouth every 6 (six) hours as needed.     propranolol (INDERAL) 10 MG tablet as needed.     REXULTI 2 MG TABS tablet      No current facility-administered medications on file prior to visit.    BP 132/82   Pulse 84   Temp 97.9 F (36.6 C) (Oral)   Ht 6' 3.5 (1.918 m)   Wt 262 lb (118.8 kg)   SpO2 96%   BMI 32.32 kg/m       Objective:   Physical Exam Vitals and nursing note reviewed.  Constitutional:      Appearance: Normal appearance. He is obese.  Cardiovascular:     Rate and Rhythm: Normal rate and regular rhythm.     Pulses: Normal pulses.          Popliteal pulses are 2+ on the right side and 2+ on the left side.       Dorsalis pedis pulses are 2+ on the right side and 2+ on the left side.       Posterior tibial pulses are 2+ on the right side and 2+ on the left side.     Heart sounds: Normal heart sounds.  Pulmonary:     Effort: Pulmonary effort is normal.     Breath sounds: Normal breath sounds.  Musculoskeletal:     Right lower leg: 1+ Pitting Edema present.     Left lower leg: 1+ Pitting Edema present.  Skin:    General: Skin is warm and dry.  Neurological:     General: No focal deficit present.     Mental Status: He is alert and oriented to person, place, and time.  Psychiatric:        Mood and Affect: Mood normal.        Behavior: Behavior normal.        Thought Content: Thought content normal.        Judgment: Judgment normal.       Assessment & Plan:  1. Bilateral lower extremity edema (Primary) - No concerns for DVT. Possible sleep apnea maybe  contributing. Will refer to pulmonary for evaluation for sleep study. Will order echo to assess cardiac function.  - Stop sodas - Will  send in a short couse of lasix to take daily for 2-3 days  - elevate feet at rest - Ambulatory referral to Pulmonology - ECHOCARDIOGRAM COMPLETE; Future - furosemide (LASIX) 20 MG tablet; Take 1 tablet (20 mg total) by mouth daily.  Dispense: 5 tablet; Refill: 0     Darleene Shape, NP      I personally spent a total of 30 minutes in the care of the patient today including preparing to see the patient, getting/reviewing separately obtained history, performing a medically appropriate exam/evaluation, counseling and educating, placing orders, and documenting clinical information in the EHR.

## 2024-10-31 ENCOUNTER — Encounter: Payer: Self-pay | Admitting: Adult Health

## 2024-10-31 NOTE — Telephone Encounter (Signed)
**Note De-identified  [place] Obfuscation** Please advise 

## 2024-11-11 ENCOUNTER — Telehealth (HOSPITAL_COMMUNITY): Payer: Self-pay | Admitting: Adult Health

## 2024-11-11 DIAGNOSIS — F330 Major depressive disorder, recurrent, mild: Secondary | ICD-10-CM | POA: Diagnosis not present

## 2024-11-11 DIAGNOSIS — Z5181 Encounter for therapeutic drug level monitoring: Secondary | ICD-10-CM | POA: Diagnosis not present

## 2024-11-11 DIAGNOSIS — F419 Anxiety disorder, unspecified: Secondary | ICD-10-CM | POA: Diagnosis not present

## 2024-11-11 DIAGNOSIS — F4321 Adjustment disorder with depressed mood: Secondary | ICD-10-CM | POA: Diagnosis not present

## 2024-11-11 DIAGNOSIS — F410 Panic disorder [episodic paroxysmal anxiety] without agoraphobia: Secondary | ICD-10-CM | POA: Diagnosis not present

## 2024-11-11 NOTE — Telephone Encounter (Signed)
 We have attempted to contact the ordering providers office to obtain a prior authorization for the ordered test.  However, we have been unsuccesful. Order will be removed from the active order WQ. Once the prior authorization is obtained we will reinstate the order and schedule patient.     11/04/24 LAST ATTEMPT TO OBTAIN  10/29/24 Inbasket sent for PA# x 2 10/22/24 Inbasket sent for PA#    Thank you

## 2024-12-10 ENCOUNTER — Encounter: Payer: Self-pay | Admitting: Internal Medicine

## 2024-12-18 ENCOUNTER — Ambulatory Visit (HOSPITAL_BASED_OUTPATIENT_CLINIC_OR_DEPARTMENT_OTHER)
Admission: RE | Admit: 2024-12-18 | Discharge: 2024-12-18 | Disposition: A | Discharge status: Home / Self Care | Payer: Self-pay | Source: Ambulatory Visit | Attending: Adult Health | Admitting: Adult Health

## 2024-12-18 DIAGNOSIS — E782 Mixed hyperlipidemia: Secondary | ICD-10-CM

## 2024-12-30 NOTE — Progress Notes (Signed)
Attempted to reach pt.  Mailbox is full.

## 2025-01-01 ENCOUNTER — Other Ambulatory Visit: Payer: Self-pay | Admitting: Adult Health

## 2025-01-01 DIAGNOSIS — E782 Mixed hyperlipidemia: Secondary | ICD-10-CM

## 2025-01-01 MED ORDER — ROSUVASTATIN CALCIUM 10 MG PO TABS: 10.0000 mg | ORAL_TABLET | Freq: Every day | ORAL | 3 refills | Status: AC

## 2025-01-02 ENCOUNTER — Ambulatory Visit

## 2025-01-02 VITALS — Ht 75.5 in | Wt 245.0 lb

## 2025-01-02 DIAGNOSIS — Z1211 Encounter for screening for malignant neoplasm of colon: Secondary | ICD-10-CM

## 2025-01-02 MED ORDER — NA SULFATE-K SULFATE-MG SULF 17.5-3.13-1.6 GM/177ML PO SOLN: 1.0000 | Freq: Once | ORAL | 0 refills | Status: AC

## 2025-01-02 NOTE — Progress Notes (Signed)

## 2025-01-08 ENCOUNTER — Encounter: Payer: Self-pay | Admitting: Internal Medicine

## 2025-01-13 ENCOUNTER — Ambulatory Visit (INDEPENDENT_AMBULATORY_CARE_PROVIDER_SITE_OTHER): Admitting: Pulmonary Disease

## 2025-01-13 ENCOUNTER — Encounter (HOSPITAL_BASED_OUTPATIENT_CLINIC_OR_DEPARTMENT_OTHER): Payer: Self-pay | Admitting: Pulmonary Disease

## 2025-01-13 VITALS — BP 113/87 | HR 80 | Ht 75.5 in | Wt 262.0 lb

## 2025-01-13 DIAGNOSIS — G4710 Hypersomnia, unspecified: Secondary | ICD-10-CM | POA: Diagnosis not present

## 2025-01-13 DIAGNOSIS — F17290 Nicotine dependence, other tobacco product, uncomplicated: Secondary | ICD-10-CM | POA: Diagnosis not present

## 2025-01-13 DIAGNOSIS — Z6832 Body mass index (BMI) 32.0-32.9, adult: Secondary | ICD-10-CM | POA: Diagnosis not present

## 2025-01-13 DIAGNOSIS — E663 Overweight: Secondary | ICD-10-CM | POA: Diagnosis not present

## 2025-01-13 DIAGNOSIS — G4733 Obstructive sleep apnea (adult) (pediatric): Secondary | ICD-10-CM

## 2025-01-13 NOTE — Progress Notes (Signed)
 Epworth Sleepiness Scale  Use the following scale to choose the most appropriate number for each situation. 0 Would never nod off 1  Slight  chance of nodding off 2 Moderate chance of nodding off 3 High chance of nodding off  Sitting and reading: 2 Watching TV: 2 Sitting, inactive, in a public place (e.g., in a meeting, theater, or dinner event): 0 As a passenger in a car for an hour or more without stopping for a break: 3 Lying down to rest when circumstances permit:3 Sitting and talking to someone: 0 Sitting quietly after a meal without alcohol: 1 In a car, while stopped for a few  minutes in traffic or at a light: 0  TOTOAL: 11

## 2025-01-13 NOTE — Progress Notes (Signed)
 "  New Patient Pulmonology Office Visit   Subjective:  Patient ID: David Salazar, male    DOB: [birth year]  MRN: [id number]  Referred by: Merna Huxley, NP  CC:  Chief Complaint  Patient presents with   Consult    Sleep apnea    Discussed the use of AI scribe software for clinical note transcription with the patient, who gave verbal consent to proceed.  History of Present Illness David Salazar is a 49 year old male who presents with potential sleep apnea.  He has received smartwatch sleep apnea alerts once or twice a week. He feels unrested on waking and has low energy and tiredness about once or twice a week. His wife occasionally notes snoring that is not reported as loud or disruptive.  He follows a regular sleep schedule from 10 PM to 5 AM. He sleeps on his side with one pillow and does not have difficulty returning to sleep after nighttime awakenings. He wakes up at night to urinate. It takes about an hour after getting up for him to feel fully awake.  During the day he feels sleepy as a passenger in the car and sometimes naps on weekends for about an hour, after which he feels groggy. He drinks three cups of coffee and three to four diet [employer] daily. His job is high stress. He denies dry mouth or headaches on waking. He does not smoke and has no difficulty completing work tasks.   ESS 13  ROS  Constitutional: negative for anorexia, fevers and sweats  Eyes: negative for irritation, redness and visual disturbance  Ears, nose, mouth, throat, and face: negative for earaches, epistaxis, nasal congestion and sore throat  Respiratory: negative for cough, dyspnea on exertion, sputum and wheezing  Cardiovascular: negative for chest pain, dyspnea, lower extremity edema, orthopnea, palpitations and syncope  Gastrointestinal: negative for abdominal pain, constipation, diarrhea, melena, nausea and vomiting  Genitourinary:negative for dysuria, frequency and hematuria   Hematologic/lymphatic: negative for bleeding, easy bruising and lymphadenopathy  Musculoskeletal:negative for arthralgias, muscle weakness and stiff joints  Neurological: negative for coordination problems, gait problems, headaches and weakness  Endocrine: negative for diabetic symptoms including polydipsia, polyuria and weight loss   Allergies: Citalopram  Current Medications[1] Past Medical History:  Diagnosis Date   Alcohol abuse    Sober for 7 years ( 2017).    Chronic lower back pain    hopefull not after this OR today (03/18/2016)   PONV (postoperative nausea and vomiting)    just w/my wisdom teeth (03/18/2016)   Past Surgical History:  Procedure Laterality Date   BACK SURGERY     LUMBAR LAMINECTOMY/DECOMPRESSION MICRODISCECTOMY N/A 03/18/2016   Procedure: Right L5-S1 Microdiscectomy;  Surgeon: Oneil JAYSON Herald, MD;  Location: [facility] OR;  Service: Orthopedics;  Laterality: N/A;   MICRODISCECTOMY LUMBAR Right 03/18/2016   L5-S1   WISDOM TOOTH EXTRACTION  1992   Family History  Problem Relation Age of Onset   Alcohol abuse Father    Hyperlipidemia Father    Diabetes Father    Colon cancer Neg Hx    Esophageal cancer Neg Hx    Stomach cancer Neg Hx    Rectal cancer Neg Hx    Social History   Socioeconomic History   Marital status: Married    Spouse name: Not on file   Number of children: Not on file   Years of education: Not on file   Highest education level: Bachelor's degree (e.g., BA, AB, BS)  Occupational History  Not on file  Tobacco Use   Smoking status: Former    Current packs/day: 0.00    Average packs/day: 1 pack/day for 13.0 years (13.0 ttl pk-yrs)    Types: E-cigarettes, Cigarettes    Start date: 45    Quit date: 2006    Years since quitting: 20.0   Smokeless tobacco: Never   Tobacco comments:    03/18/2016 I use an electronic cigarette  Vaping Use   Vaping status: Some Days   Substances: Nicotine  Substance and Sexual Activity   Alcohol use: No    Drug use: No   Sexual activity: Yes  Other Topics Concern   Not on file  Social History Narrative   Not on file   Social Drivers of Health   Tobacco Use: Medium Risk (01/13/2025)   Patient History    Smoking Tobacco Use: Former    Smokeless Tobacco Use: Never    Passive Exposure: Not on Actuary Strain: Low Risk (07/15/2024)   Overall Financial Resource Strain (CARDIA)    Difficulty of Paying Living Expenses: Not very hard  Food Insecurity: No Food Insecurity (07/15/2024)   Epic    Worried About Radiation Protection Practitioner of Food in the Last Year: Never true    Ran Out of Food in the Last Year: Never true  Transportation Needs: No Transportation Needs (07/15/2024)   Epic    Lack of Transportation (Medical): No    Lack of Transportation (Non-Medical): No  Physical Activity: Sufficiently Active (07/15/2024)   Exercise Vital Sign    Days of Exercise per Week: 5 days    Minutes of Exercise per Session: 50 min  Stress: Stress Concern Present (07/15/2024)   [employer] of Occupational Health - Occupational Stress Questionnaire    Feeling of Stress: To some extent  Social Connections: Moderately Integrated (07/15/2024)   Social Connection and Isolation Panel    Frequency of Communication with Friends and Family: More than three times a week    Frequency of Social Gatherings with Friends and Family: Twice a week    Attends Religious Services: Never    Database Administrator or Organizations: Yes    Attends Engineer, Structural: More than 4 times per year    Marital Status: Married  Catering Manager Violence: Not on file  Depression (PHQ2-9): Low Risk (07/16/2024)   Depression (PHQ2-9)    PHQ-2 Score: 0  Alcohol Screen: Not on file  Housing: Low Risk (07/15/2024)   Epic    Unable to Pay for Housing in the Last Year: No    Number of Times Moved in the Last Year: 0    Homeless in the Last Year: No  Utilities: Not on file  Health Literacy: Not on file        Objective:  BP 113/87   Pulse 80   Ht 6' 3.5 (1.918 m)   Wt 262 lb (118.8 kg)   SpO2 97%   BMI 32.32 kg/m    Physical Exam  Gen. Pleasant, obese, in no distress ENT - 1mm overbite , nml tonsils, no post nasal drip Neck: No JVD, no thyromegaly, no carotid bruits Lungs: no use of accessory muscles, no dullness to percussion, decreased without rales or rhonchi  Cardiovascular: Rhythm regular, heart sounds  normal, no murmurs or gallops, no peripheral edema Musculoskeletal: No deformities, no cyanosis or clubbing , no tremors       Assessment & Plan:  Assessment and Plan Assessment & Plan Suspected obstructive sleep  apnea Intermittent sleep apnea alerts from smartwatch, occurring once or twice a week. Occasional non-restorative sleep and daytime sleepiness, particularly when driving. No significant snoring reported by spouse. High caffeine intake noted. No alarming symptoms on examination. Differential includes obstructive sleep apnea, but not definitive without further testing. Discussed potential risks of untreated sleep apnea, including cardiovascular and cognitive effects. Explained that the smartwatch's correlation with sleep apnea is 70-80%, and a sleep study is needed for confirmation. Explained that less than five events per hour is normal, 5-15 events per hour is mild, and more than 30 events per hour is severe. Treatment options include weight loss, mouth guard for mild to moderate cases, and CPAP for severe cases. - Ordered home sleep test to confirm diagnosis of obstructive sleep apnea. - Educated on potential treatment options based on severity of sleep apnea, including weight loss, mouth guard, and CPAP. - Discussed potential risks of untreated sleep apnea, including cardiovascular and cognitive effects.  Overweight BMI in the 30s, indicating mild overweight status. Discussed the role of weight in obstructive sleep apnea and the benefits of weight loss in reducing  symptoms and improving overall health. Emphasized that even a 10-20 pound weight loss can significantly impact sleep apnea severity. - Encouraged weight loss as a long-term strategy to manage sleep apnea and improve overall health.       No follow-ups on file.   Harden ROCKFORD Jude, MD     [1]  Current Outpatient Medications:    ALPRAZolam  (XANAX ) 0.5 MG tablet, Take 0.5 mg by mouth daily as needed., Disp: , Rfl:    buPROPion (WELLBUTRIN XL) 300 MG 24 hr tablet, Take 300 mg by mouth every morning., Disp: , Rfl:    DULoxetine (CYMBALTA) 60 MG capsule, Take 120 mg by mouth daily., Disp: , Rfl:    ibuprofen  (ADVIL ,MOTRIN ) 200 MG tablet, Take 200 mg by mouth every 6 (six) hours as needed., Disp: , Rfl:    propranolol (INDERAL) 10 MG tablet, as needed., Disp: , Rfl:    REXULTI 2 MG TABS tablet, , Disp: , Rfl:    rosuvastatin  (CRESTOR ) 10 MG tablet, Take 1 tablet (10 mg total) by mouth daily., Disp: 90 tablet, Rfl: 3   furosemide  (LASIX ) 20 MG tablet, Take 1 tablet (20 mg total) by mouth daily. (Patient not taking: Reported on 01/13/2025), Disp: 5 tablet, Rfl: 0  "

## 2025-01-13 NOTE — Patient Instructions (Signed)
" °  VISIT SUMMARY: Caydan Mctavish, you came in today because of potential sleep apnea. You have been experiencing sleep apnea alerts from your smartwatch once or twice a week, feeling unrested upon waking, and experiencing low energy and tiredness. Your wife has occasionally noted snoring. You follow a regular sleep schedule and have a high caffeine intake. You do not smoke and have no difficulty completing work tasks.  YOUR PLAN: -SUSPECTED OBSTRUCTIVE SLEEP APNEA: Obstructive sleep apnea is a condition where your breathing stops and starts during sleep. We discussed the potential risks of untreated sleep apnea, including cardiovascular and cognitive effects. We also talked about the accuracy of your smartwatch alerts and the need for a sleep study to confirm the diagnosis. We ordered a home sleep test to confirm if you have obstructive sleep apnea. Depending on the severity, treatment options may include weight loss, a mouth guard, or CPAP therapy.  -OVERWEIGHT: Being overweight can contribute to obstructive sleep apnea. Your BMI indicates that you are mildly overweight. We discussed the importance of weight loss in managing sleep apnea and improving overall health. Even a 10-20 pound weight loss can significantly reduce the severity of sleep apnea. We encourage you to work on weight loss as a long-term strategy.  INSTRUCTIONS: Please complete the home sleep test as ordered to confirm the diagnosis of obstructive sleep apnea. Follow up with us  once you have the results so we can discuss the appropriate treatment options based on the severity of your condition. Additionally, focus on weight loss to help manage your sleep apnea and improve your overall health.    Contains text generated by [employer].   "

## 2025-01-16 ENCOUNTER — Encounter: Payer: Self-pay | Admitting: Internal Medicine

## 2025-01-16 ENCOUNTER — Ambulatory Visit: Admitting: Internal Medicine

## 2025-01-16 VITALS — BP 110/67 | HR 76 | Temp 97.5°F | Resp 13 | Ht 75.5 in | Wt 245.0 lb

## 2025-01-16 DIAGNOSIS — K5730 Diverticulosis of large intestine without perforation or abscess without bleeding: Secondary | ICD-10-CM | POA: Diagnosis not present

## 2025-01-16 DIAGNOSIS — K648 Other hemorrhoids: Secondary | ICD-10-CM | POA: Diagnosis not present

## 2025-01-16 DIAGNOSIS — Z1211 Encounter for screening for malignant neoplasm of colon: Secondary | ICD-10-CM

## 2025-01-16 MED ORDER — SODIUM CHLORIDE 0.9 % IV SOLN: 500.0000 mL | INTRAVENOUS | Status: DC

## 2025-01-16 NOTE — Progress Notes (Signed)
 Pt's states no medical or surgical changes since previsit or office visit.

## 2025-01-16 NOTE — Op Note (Signed)
 [facility] Patient Name: David Salazar Procedure Date: 01/16/2025 9:34 AM MRN: [id number] Endoscopist: Norleen SAILOR. Abran , MD, [id number] Age: 49 Referring MD:  Date of Birth: 1976/09/23 Gender: Male Account #: [id number] Procedure:                Colonoscopy Indications:              Screening for colorectal malignant neoplasm Medicines:                Monitored Anesthesia Care Procedure:                Pre-Anesthesia Assessment:                           - Prior to the procedure, a History and Physical                            was performed, and patient medications and                            allergies were reviewed. The patient's tolerance of                            previous anesthesia was also reviewed. The risks                            and benefits of the procedure and the sedation                            options and risks were discussed with the patient.                            All questions were answered, and informed consent                            was obtained. Prior Anticoagulants: The patient has                            taken no anticoagulant or antiplatelet agents. ASA                            Grade Assessment: II - A patient with mild systemic                            disease. After reviewing the risks and benefits,                            the patient was deemed in satisfactory condition to                            undergo the procedure.                           After obtaining informed consent, the colonoscope  was passed under direct vision. Throughout the                            procedure, the patient's blood pressure, pulse, and                            oxygen saturations were monitored continuously. The                            CF [id number] #[id number] was introduced through the anus                            and advanced to the the cecum, identified by                            appendiceal orifice  and ileocecal valve. The                            ileocecal valve, appendiceal orifice, and rectum                            were photographed. The quality of the bowel                            preparation was good. The colonoscopy was performed                            without difficulty. The patient tolerated the                            procedure well. The bowel preparation used was                            SUPREP via split dose instruction. Scope In: 9:44:46 AM Scope Out: 10:05:23 AM Scope Withdrawal Time: 0 hours 15 minutes 57 seconds  Total Procedure Duration: 0 hours 20 minutes 37 seconds  Findings:                 A few small-mouthed diverticula were found in the                            left colon.                           Internal hemorrhoids were found during                            retroflexion. The hemorrhoids were small.                           The exam was otherwise without abnormality on                            direct and retroflexion views. Complications:            No  immediate complications. Estimated blood loss:                            None. Estimated Blood Loss:     Estimated blood loss: none. Impression:               - Diverticulosis in the left colon.                           - Internal hemorrhoids.                           - The examination was otherwise normal on direct                            and retroflexion views.                           - No specimens collected. Recommendation:           - Repeat colonoscopy in 10 years for screening                            purposes.                           - Patient has a contact number available for                            emergencies. The signs and symptoms of potential                            delayed complications were discussed with the                            patient. Return to normal activities tomorrow.                            Written discharge instructions were  provided to the                            patient.                           - Resume previous diet.                           - Continue present medications. Norleen SAILOR. Abran, MD 01/16/2025 10:13:17 AM This report has been signed electronically.

## 2025-01-16 NOTE — Progress Notes (Signed)
 HISTORY OF PRESENT ILLNESS:  David Salazar is a 49 y.o. male present today directly for screening colonoscopy.  No complaints  REVIEW OF SYSTEMS:  All non-GI ROS negative except for  Past Medical History:  Diagnosis Date   Alcohol abuse    Sober for 7 years ( 2017).    Allergy 1/01/20190   Anxiety 2019   Chronic lower back pain    hopefull not after this OR today (03/18/2016)   Depression 12/26/2017   PONV (postoperative nausea and vomiting)    just w/my wisdom teeth (03/18/2016)   Substance abuse (HCC) 2010    Past Surgical History:  Procedure Laterality Date   BACK SURGERY     LUMBAR LAMINECTOMY/DECOMPRESSION MICRODISCECTOMY N/A 03/18/2016   Procedure: Right L5-S1 Microdiscectomy;  Surgeon: Oneil JAYSON Herald, MD;  Location: [facility] OR;  Service: Orthopedics;  Laterality: N/A;   MICRODISCECTOMY LUMBAR Right 03/18/2016   L5-S1   SPINE SURGERY  2019   WISDOM TOOTH EXTRACTION  12/26/1990    Social History Ioan Landini  reports that he quit smoking about 20 years ago. His smoking use included e-cigarettes and cigarettes. He started smoking about 33 years ago. He has a 13 pack-year smoking history. He has never used smokeless tobacco. He reports that he does not drink alcohol and does not use drugs.  family history includes Alcohol abuse in his father; Cancer in his mother; Diabetes in his father; Hyperlipidemia in his father.  Allergies[1]     PHYSICAL EXAMINATION: Vital signs: BP 113/83   Pulse 77   Temp (!) 97.5 F (36.4 C) (Temporal)   Ht 6' 3.5 (1.918 m)   Wt 245 lb (111.1 kg)   SpO2 96%   BMI 30.22 kg/m  General: Well-developed, well-nourished, no acute distress HEENT: Sclerae are anicteric, conjunctiva pink. Oral mucosa intact Lungs: Clear Heart: Regular Abdomen: soft, nontender, nondistended, no obvious ascites, no peritoneal signs, normal bowel sounds. No organomegaly. Extremities: No edema Psychiatric: alert and oriented x3.  Cooperative     ASSESSMENT:  Colon cancer screening   PLAN:  Screening colonoscopy.         [1]  Allergies Allergen Reactions   Citalopram  Anxiety    Abdominal Pain

## 2025-01-16 NOTE — Progress Notes (Signed)
 Vss nad trans to pacu

## 2025-01-16 NOTE — Patient Instructions (Signed)

## 2025-01-17 ENCOUNTER — Telehealth: Payer: Self-pay

## 2025-01-17 NOTE — Telephone Encounter (Signed)
" °  Follow up Call-     01/16/2025    8:19 AM  Call back number  Post procedure Call Back phone  # [phone]  Permission to leave phone message Yes     Patient questions:  Do you have a fever, pain , or abdominal swelling? No. Pain Score  0 *  Have you tolerated food without any problems? Yes.    Have you been able to return to your normal activities? Yes.    Do you have any questions about your discharge instructions: Diet   No. Medications  No. Follow up visit  No.  Do you have questions or concerns about your Care? No.  Actions: * If pain score is 4 or above: No action needed, pain <4.   "
